# Patient Record
Sex: Male | Born: 1960 | Race: Black or African American | Hispanic: No | Smoking: Current every day smoker
Health system: Southern US, Community
[De-identification: ages and names within clinical notes are randomized; demographics above are authoritative.]

## PROBLEM LIST (undated history)

## (undated) DIAGNOSIS — H269 Unspecified cataract: Secondary | ICD-10-CM

## (undated) DIAGNOSIS — H5440 Blindness, one eye, unspecified eye: Secondary | ICD-10-CM

## (undated) DIAGNOSIS — M1990 Unspecified osteoarthritis, unspecified site: Secondary | ICD-10-CM

## (undated) HISTORY — DX: Unspecified cataract: H26.9

## (undated) HISTORY — DX: Blindness, one eye, unspecified eye: H54.40

## (undated) HISTORY — DX: Unspecified osteoarthritis, unspecified site: M19.90

---

## 2002-06-02 ENCOUNTER — Emergency Department (HOSPITAL_COMMUNITY): Admission: EM | Admit: 2002-06-02 | Discharge: 2002-06-02 | Payer: Self-pay | Admitting: Emergency Medicine

## 2002-06-02 ENCOUNTER — Encounter: Payer: Self-pay | Admitting: Emergency Medicine

## 2004-08-26 ENCOUNTER — Ambulatory Visit: Payer: Self-pay | Admitting: *Deleted

## 2005-02-16 ENCOUNTER — Emergency Department (HOSPITAL_COMMUNITY): Admission: EM | Admit: 2005-02-16 | Discharge: 2005-02-16 | Payer: Self-pay | Admitting: Emergency Medicine

## 2005-03-16 ENCOUNTER — Ambulatory Visit: Payer: Self-pay | Admitting: Nurse Practitioner

## 2006-02-21 ENCOUNTER — Emergency Department (HOSPITAL_COMMUNITY): Admission: EM | Admit: 2006-02-21 | Discharge: 2006-02-21 | Payer: Self-pay | Admitting: *Deleted

## 2006-03-10 ENCOUNTER — Emergency Department (HOSPITAL_COMMUNITY): Admission: EM | Admit: 2006-03-10 | Discharge: 2006-03-10 | Payer: Self-pay | Admitting: Emergency Medicine

## 2006-03-26 ENCOUNTER — Emergency Department (HOSPITAL_COMMUNITY): Admission: EM | Admit: 2006-03-26 | Discharge: 2006-03-26 | Payer: Self-pay | Admitting: Emergency Medicine

## 2006-05-06 ENCOUNTER — Emergency Department (HOSPITAL_COMMUNITY): Admission: EM | Admit: 2006-05-06 | Discharge: 2006-05-06 | Payer: Self-pay | Admitting: Emergency Medicine

## 2006-06-06 ENCOUNTER — Emergency Department (HOSPITAL_COMMUNITY): Admission: EM | Admit: 2006-06-06 | Discharge: 2006-06-06 | Payer: Self-pay | Admitting: Emergency Medicine

## 2007-04-19 ENCOUNTER — Emergency Department (HOSPITAL_COMMUNITY): Admission: EM | Admit: 2007-04-19 | Discharge: 2007-04-19 | Payer: Self-pay | Admitting: Emergency Medicine

## 2007-05-06 ENCOUNTER — Emergency Department (HOSPITAL_COMMUNITY): Admission: EM | Admit: 2007-05-06 | Discharge: 2007-05-06 | Payer: Self-pay | Admitting: Emergency Medicine

## 2007-05-27 ENCOUNTER — Emergency Department (HOSPITAL_COMMUNITY): Admission: EM | Admit: 2007-05-27 | Discharge: 2007-05-27 | Payer: Self-pay | Admitting: Emergency Medicine

## 2007-07-03 ENCOUNTER — Emergency Department (HOSPITAL_COMMUNITY): Admission: EM | Admit: 2007-07-03 | Discharge: 2007-07-03 | Payer: Self-pay | Admitting: Emergency Medicine

## 2007-08-01 ENCOUNTER — Emergency Department (HOSPITAL_COMMUNITY): Admission: EM | Admit: 2007-08-01 | Discharge: 2007-08-01 | Payer: Self-pay | Admitting: Emergency Medicine

## 2007-09-15 ENCOUNTER — Emergency Department (HOSPITAL_COMMUNITY): Admission: EM | Admit: 2007-09-15 | Discharge: 2007-09-15 | Payer: Self-pay | Admitting: Emergency Medicine

## 2007-10-07 ENCOUNTER — Emergency Department (HOSPITAL_COMMUNITY): Admission: EM | Admit: 2007-10-07 | Discharge: 2007-10-07 | Payer: Self-pay | Admitting: Emergency Medicine

## 2007-10-24 ENCOUNTER — Emergency Department (HOSPITAL_COMMUNITY): Admission: EM | Admit: 2007-10-24 | Discharge: 2007-10-24 | Payer: Self-pay | Admitting: Emergency Medicine

## 2007-11-05 ENCOUNTER — Emergency Department (HOSPITAL_COMMUNITY): Admission: EM | Admit: 2007-11-05 | Discharge: 2007-11-05 | Payer: Self-pay | Admitting: Emergency Medicine

## 2008-01-30 ENCOUNTER — Emergency Department (HOSPITAL_COMMUNITY): Admission: EM | Admit: 2008-01-30 | Discharge: 2008-01-30 | Payer: Self-pay | Admitting: Emergency Medicine

## 2008-02-16 ENCOUNTER — Emergency Department (HOSPITAL_COMMUNITY): Admission: EM | Admit: 2008-02-16 | Discharge: 2008-02-16 | Payer: Self-pay | Admitting: Emergency Medicine

## 2008-02-20 ENCOUNTER — Emergency Department (HOSPITAL_COMMUNITY): Admission: EM | Admit: 2008-02-20 | Discharge: 2008-02-20 | Payer: Self-pay | Admitting: Emergency Medicine

## 2008-02-26 ENCOUNTER — Emergency Department (HOSPITAL_COMMUNITY): Admission: EM | Admit: 2008-02-26 | Discharge: 2008-02-26 | Payer: Self-pay | Admitting: Emergency Medicine

## 2008-03-01 ENCOUNTER — Emergency Department (HOSPITAL_COMMUNITY): Admission: EM | Admit: 2008-03-01 | Discharge: 2008-03-02 | Payer: Self-pay | Admitting: Emergency Medicine

## 2008-03-05 ENCOUNTER — Emergency Department (HOSPITAL_COMMUNITY): Admission: EM | Admit: 2008-03-05 | Discharge: 2008-03-05 | Payer: Self-pay | Admitting: Emergency Medicine

## 2008-03-09 ENCOUNTER — Emergency Department (HOSPITAL_COMMUNITY): Admission: EM | Admit: 2008-03-09 | Discharge: 2008-03-10 | Payer: Self-pay | Admitting: Emergency Medicine

## 2008-03-15 ENCOUNTER — Emergency Department (HOSPITAL_COMMUNITY): Admission: EM | Admit: 2008-03-15 | Discharge: 2008-03-15 | Payer: Self-pay | Admitting: Emergency Medicine

## 2008-03-19 ENCOUNTER — Emergency Department (HOSPITAL_COMMUNITY): Admission: EM | Admit: 2008-03-19 | Discharge: 2008-03-19 | Payer: Self-pay | Admitting: Emergency Medicine

## 2008-03-29 ENCOUNTER — Emergency Department (HOSPITAL_COMMUNITY): Admission: EM | Admit: 2008-03-29 | Discharge: 2008-03-29 | Payer: Self-pay | Admitting: Emergency Medicine

## 2008-04-08 ENCOUNTER — Emergency Department (HOSPITAL_COMMUNITY): Admission: EM | Admit: 2008-04-08 | Discharge: 2008-04-08 | Payer: Self-pay | Admitting: Emergency Medicine

## 2008-04-14 ENCOUNTER — Emergency Department (HOSPITAL_COMMUNITY): Admission: EM | Admit: 2008-04-14 | Discharge: 2008-04-14 | Payer: Self-pay | Admitting: Emergency Medicine

## 2008-04-28 ENCOUNTER — Emergency Department (HOSPITAL_COMMUNITY): Admission: EM | Admit: 2008-04-28 | Discharge: 2008-04-28 | Payer: Self-pay | Admitting: Emergency Medicine

## 2008-05-08 ENCOUNTER — Emergency Department (HOSPITAL_COMMUNITY): Admission: EM | Admit: 2008-05-08 | Discharge: 2008-05-08 | Payer: Self-pay | Admitting: Emergency Medicine

## 2008-07-02 ENCOUNTER — Emergency Department (HOSPITAL_COMMUNITY): Admission: EM | Admit: 2008-07-02 | Discharge: 2008-07-02 | Payer: Self-pay | Admitting: Emergency Medicine

## 2009-02-27 ENCOUNTER — Emergency Department (HOSPITAL_COMMUNITY): Admission: EM | Admit: 2009-02-27 | Discharge: 2009-02-27 | Payer: Self-pay | Admitting: Emergency Medicine

## 2009-03-01 ENCOUNTER — Emergency Department (HOSPITAL_COMMUNITY): Admission: EM | Admit: 2009-03-01 | Discharge: 2009-03-02 | Payer: Self-pay | Admitting: Emergency Medicine

## 2010-01-23 ENCOUNTER — Emergency Department (HOSPITAL_COMMUNITY): Admission: EM | Admit: 2010-01-23 | Discharge: 2010-01-23 | Payer: Self-pay | Admitting: Emergency Medicine

## 2010-02-17 ENCOUNTER — Emergency Department (HOSPITAL_COMMUNITY): Admission: EM | Admit: 2010-02-17 | Discharge: 2010-02-17 | Payer: Self-pay | Admitting: Emergency Medicine

## 2010-03-05 ENCOUNTER — Emergency Department (HOSPITAL_COMMUNITY): Admission: EM | Admit: 2010-03-05 | Discharge: 2010-03-06 | Payer: Self-pay | Admitting: Emergency Medicine

## 2010-03-11 ENCOUNTER — Emergency Department (HOSPITAL_COMMUNITY): Admission: EM | Admit: 2010-03-11 | Discharge: 2010-03-11 | Payer: Self-pay | Admitting: Emergency Medicine

## 2010-03-22 ENCOUNTER — Observation Stay (HOSPITAL_COMMUNITY): Admission: EM | Admit: 2010-03-22 | Discharge: 2010-03-22 | Payer: Self-pay | Admitting: Emergency Medicine

## 2010-04-18 ENCOUNTER — Emergency Department (HOSPITAL_COMMUNITY): Admission: EM | Admit: 2010-04-18 | Discharge: 2010-04-18 | Payer: Self-pay | Admitting: Emergency Medicine

## 2010-09-11 ENCOUNTER — Emergency Department (HOSPITAL_COMMUNITY): Admission: EM | Admit: 2010-09-11 | Discharge: 2010-02-23 | Payer: Self-pay | Admitting: Emergency Medicine

## 2011-01-05 ENCOUNTER — Emergency Department (HOSPITAL_COMMUNITY)
Admission: EM | Admit: 2011-01-05 | Discharge: 2011-01-05 | Disposition: A | Discharge status: Home / Self Care | Payer: Self-pay | Attending: Emergency Medicine | Admitting: Emergency Medicine

## 2011-01-05 DIAGNOSIS — J45909 Unspecified asthma, uncomplicated: Secondary | ICD-10-CM | POA: Insufficient documentation

## 2011-01-05 DIAGNOSIS — Z5900 Homelessness unspecified: Secondary | ICD-10-CM | POA: Insufficient documentation

## 2011-01-13 LAB — DIFFERENTIAL
Basophils Absolute: 0 10*3/uL (ref 0.0–0.1)
Eosinophils Absolute: 0.2 10*3/uL (ref 0.0–0.7)
Lymphs Abs: 1.4 10*3/uL (ref 0.7–4.0)
Neutrophils Relative %: 73 % (ref 43–77)

## 2011-01-13 LAB — BASIC METABOLIC PANEL
BUN: 14 mg/dL (ref 6–23)
CO2: 25 mEq/L (ref 19–32)
Calcium: 9.2 mg/dL (ref 8.4–10.5)
Chloride: 103 mEq/L (ref 96–112)
Creatinine, Ser: 0.97 mg/dL (ref 0.4–1.5)
GFR calc Af Amer: >60 mL/min (ref >60)
Glucose, Bld: 73 mg/dL (ref 70–99)
Potassium: 4 mEq/L (ref 3.5–5.1)
Sodium: 137 mEq/L (ref 135–145)

## 2011-01-13 LAB — CBC
HCT: 43.8 % (ref 39.0–52.0)
Hemoglobin: 14.7 g/dL (ref 13.0–17.0)
MCV: 91.1 fL (ref 78.0–100.0)
WBC: 7.7 10*3/uL (ref 4.0–10.5)

## 2011-01-21 ENCOUNTER — Emergency Department (HOSPITAL_COMMUNITY)
Admission: EM | Admit: 2011-01-21 | Discharge: 2011-01-21 | Disposition: A | Discharge status: Home / Self Care | Payer: Self-pay | Attending: Emergency Medicine | Admitting: Emergency Medicine

## 2011-01-21 DIAGNOSIS — J45909 Unspecified asthma, uncomplicated: Secondary | ICD-10-CM | POA: Insufficient documentation

## 2011-01-28 ENCOUNTER — Emergency Department (HOSPITAL_COMMUNITY)
Admission: EM | Admit: 2011-01-28 | Discharge: 2011-01-28 | Disposition: A | Discharge status: Home / Self Care | Payer: Self-pay | Attending: Emergency Medicine | Admitting: Emergency Medicine

## 2011-01-28 DIAGNOSIS — J45909 Unspecified asthma, uncomplicated: Secondary | ICD-10-CM | POA: Insufficient documentation

## 2011-01-28 DIAGNOSIS — Z5900 Homelessness unspecified: Secondary | ICD-10-CM | POA: Insufficient documentation

## 2011-01-28 DIAGNOSIS — F17200 Nicotine dependence, unspecified, uncomplicated: Secondary | ICD-10-CM | POA: Insufficient documentation

## 2011-02-08 ENCOUNTER — Emergency Department (HOSPITAL_COMMUNITY)
Admission: EM | Admit: 2011-02-08 | Discharge: 2011-02-08 | Disposition: A | Discharge status: Home / Self Care | Payer: Self-pay | Attending: Emergency Medicine | Admitting: Emergency Medicine

## 2011-02-08 DIAGNOSIS — J45909 Unspecified asthma, uncomplicated: Secondary | ICD-10-CM | POA: Insufficient documentation

## 2011-02-17 ENCOUNTER — Emergency Department (HOSPITAL_COMMUNITY): Payer: Self-pay

## 2011-02-17 ENCOUNTER — Emergency Department (HOSPITAL_COMMUNITY)
Admission: EM | Admit: 2011-02-17 | Discharge: 2011-02-17 | Disposition: A | Discharge status: Home / Self Care | Payer: Self-pay | Attending: Emergency Medicine | Admitting: Emergency Medicine

## 2011-02-17 DIAGNOSIS — J45909 Unspecified asthma, uncomplicated: Secondary | ICD-10-CM | POA: Insufficient documentation

## 2011-02-17 DIAGNOSIS — F411 Generalized anxiety disorder: Secondary | ICD-10-CM | POA: Insufficient documentation

## 2011-02-17 DIAGNOSIS — R079 Chest pain, unspecified: Secondary | ICD-10-CM | POA: Insufficient documentation

## 2011-02-17 LAB — BASIC METABOLIC PANEL
BUN: 9 mg/dL (ref 6–23)
CO2: 25 mEq/L (ref 19–32)
Chloride: 96 mEq/L (ref 96–112)
Creatinine, Ser: 0.88 mg/dL (ref 0.4–1.5)
GFR calc Af Amer: >60 mL/min (ref >60)
Glucose, Bld: 86 mg/dL (ref 70–99)
Potassium: 3.8 mEq/L (ref 3.5–5.1)

## 2011-02-17 LAB — DIFFERENTIAL
Basophils Relative: 1 % (ref 0–1)
Eosinophils Absolute: 0.3 10*3/uL (ref 0.0–0.7)
Lymphocytes Relative: 13 % (ref 12–46)
Monocytes Relative: 5 % (ref 3–12)
Neutrophils Relative %: 77 % (ref 43–77)

## 2011-02-17 LAB — CBC
HCT: 43.8 % (ref 39.0–52.0)
MCH: 31.1 pg (ref 26.0–34.0)
MCHC: 34.7 g/dL (ref 30.0–36.0)
MCV: 89.6 fL (ref 78.0–100.0)
RDW: 13.6 % (ref 11.5–15.5)
WBC: 6.8 10*3/uL (ref 4.0–10.5)

## 2011-03-22 ENCOUNTER — Emergency Department (HOSPITAL_COMMUNITY)
Admission: EM | Admit: 2011-03-22 | Discharge: 2011-03-22 | Disposition: A | Discharge status: Home / Self Care | Payer: Self-pay | Attending: Emergency Medicine | Admitting: Emergency Medicine

## 2011-03-22 DIAGNOSIS — F17200 Nicotine dependence, unspecified, uncomplicated: Secondary | ICD-10-CM | POA: Insufficient documentation

## 2011-03-22 DIAGNOSIS — R0602 Shortness of breath: Secondary | ICD-10-CM | POA: Insufficient documentation

## 2011-03-22 DIAGNOSIS — J45909 Unspecified asthma, uncomplicated: Secondary | ICD-10-CM | POA: Insufficient documentation

## 2011-03-29 ENCOUNTER — Emergency Department (HOSPITAL_COMMUNITY): Payer: Self-pay

## 2011-03-29 ENCOUNTER — Emergency Department (HOSPITAL_COMMUNITY)
Admission: EM | Admit: 2011-03-29 | Discharge: 2011-03-29 | Disposition: A | Discharge status: Home / Self Care | Payer: Self-pay | Attending: Emergency Medicine | Admitting: Emergency Medicine

## 2011-03-29 DIAGNOSIS — J45909 Unspecified asthma, uncomplicated: Secondary | ICD-10-CM | POA: Insufficient documentation

## 2011-03-29 DIAGNOSIS — Z5900 Homelessness unspecified: Secondary | ICD-10-CM | POA: Insufficient documentation

## 2011-03-29 DIAGNOSIS — R059 Cough, unspecified: Secondary | ICD-10-CM | POA: Insufficient documentation

## 2011-03-29 DIAGNOSIS — R05 Cough: Secondary | ICD-10-CM | POA: Insufficient documentation

## 2011-03-29 DIAGNOSIS — F17200 Nicotine dependence, unspecified, uncomplicated: Secondary | ICD-10-CM | POA: Insufficient documentation

## 2011-03-29 DIAGNOSIS — R0602 Shortness of breath: Secondary | ICD-10-CM | POA: Insufficient documentation

## 2011-04-20 ENCOUNTER — Emergency Department (HOSPITAL_COMMUNITY)
Admission: EM | Admit: 2011-04-20 | Discharge: 2011-04-20 | Disposition: A | Discharge status: Home / Self Care | Payer: Self-pay | Attending: Emergency Medicine | Admitting: Emergency Medicine

## 2011-04-20 DIAGNOSIS — J45901 Unspecified asthma with (acute) exacerbation: Secondary | ICD-10-CM | POA: Insufficient documentation

## 2011-04-20 DIAGNOSIS — R05 Cough: Secondary | ICD-10-CM | POA: Insufficient documentation

## 2011-04-20 DIAGNOSIS — R0602 Shortness of breath: Secondary | ICD-10-CM | POA: Insufficient documentation

## 2011-04-20 DIAGNOSIS — R059 Cough, unspecified: Secondary | ICD-10-CM | POA: Insufficient documentation

## 2011-05-05 ENCOUNTER — Emergency Department (HOSPITAL_COMMUNITY)
Admission: EM | Admit: 2011-05-05 | Discharge: 2011-05-05 | Disposition: A | Discharge status: Home / Self Care | Payer: Self-pay | Attending: Emergency Medicine | Admitting: Emergency Medicine

## 2011-05-05 DIAGNOSIS — J45909 Unspecified asthma, uncomplicated: Secondary | ICD-10-CM | POA: Insufficient documentation

## 2011-05-05 DIAGNOSIS — Z5900 Homelessness unspecified: Secondary | ICD-10-CM | POA: Insufficient documentation

## 2011-05-05 DIAGNOSIS — F17200 Nicotine dependence, unspecified, uncomplicated: Secondary | ICD-10-CM | POA: Insufficient documentation

## 2011-07-01 LAB — CBC
HCT: 42.7
MCHC: 35.1
RDW: 13.3

## 2011-07-01 LAB — COMPREHENSIVE METABOLIC PANEL
AST: 36
Albumin: 3.8
Calcium: 9.6
Creatinine, Ser: 1.3
GFR calc Af Amer: >60
GFR calc non Af Amer: 59 — ABNORMAL LOW

## 2011-07-01 LAB — URINE CULTURE
Colony Count: NO GROWTH
Culture: NO GROWTH

## 2011-07-01 LAB — URINALYSIS, ROUTINE W REFLEX MICROSCOPIC
Ketones, ur: NEGATIVE
Nitrite: POSITIVE — AB
Specific Gravity, Urine: 1.024
Urobilinogen, UA: 0.2
pH: 5.5

## 2011-07-01 LAB — URINE MICROSCOPIC-ADD ON

## 2011-12-08 ENCOUNTER — Emergency Department (HOSPITAL_COMMUNITY)
Admission: EM | Admit: 2011-12-08 | Discharge: 2011-12-08 | Disposition: A | Discharge status: Home / Self Care | Payer: Self-pay | Attending: Emergency Medicine | Admitting: Emergency Medicine

## 2011-12-08 ENCOUNTER — Emergency Department (HOSPITAL_COMMUNITY): Payer: Self-pay

## 2011-12-08 ENCOUNTER — Encounter (HOSPITAL_COMMUNITY): Payer: Self-pay | Admitting: Emergency Medicine

## 2011-12-08 DIAGNOSIS — J441 Chronic obstructive pulmonary disease with (acute) exacerbation: Secondary | ICD-10-CM | POA: Insufficient documentation

## 2011-12-08 DIAGNOSIS — F17200 Nicotine dependence, unspecified, uncomplicated: Secondary | ICD-10-CM | POA: Insufficient documentation

## 2011-12-08 MED ORDER — IPRATROPIUM BROMIDE 0.02 % IN SOLN
RESPIRATORY_TRACT | Status: AC
  Filled 2011-12-08: qty 2.5

## 2011-12-08 MED ORDER — IPRATROPIUM BROMIDE 0.02 % IN SOLN
0.5000 mg | Freq: Once | RESPIRATORY_TRACT | Status: AC
  Administered 2011-12-08: 0.5 mg via RESPIRATORY_TRACT

## 2011-12-08 MED ORDER — ALBUTEROL SULFATE (5 MG/ML) 0.5% IN NEBU
INHALATION_SOLUTION | RESPIRATORY_TRACT | Status: AC
  Filled 2011-12-08: qty 1

## 2011-12-08 MED ORDER — ALBUTEROL SULFATE (5 MG/ML) 0.5% IN NEBU
5.0000 mg | INHALATION_SOLUTION | Freq: Once | RESPIRATORY_TRACT | Status: AC
  Administered 2011-12-08: 5 mg via RESPIRATORY_TRACT

## 2011-12-08 MED ORDER — PREDNISONE (PAK) 10 MG PO TABS: 40.0000 mg | ORAL_TABLET | Freq: Every day | ORAL | Status: AC

## 2011-12-08 MED ORDER — PREDNISONE 20 MG PO TABS
60.0000 mg | ORAL_TABLET | Freq: Once | ORAL | Status: AC
  Administered 2011-12-08: 60 mg via ORAL
  Filled 2011-12-08: qty 3

## 2011-12-08 MED ORDER — ALBUTEROL SULFATE HFA 108 (90 BASE) MCG/ACT IN AERS
2.0000 | INHALATION_SPRAY | Freq: Once | RESPIRATORY_TRACT | Status: AC
  Administered 2011-12-08: 2 via RESPIRATORY_TRACT
  Filled 2011-12-08: qty 6.7

## 2011-12-08 NOTE — ED Provider Notes (Signed)
History     CSN: [id number]  Arrival date & time 12/08/11  7829   First MD Initiated Contact with Patient 12/08/11 2211      Chief Complaint  Patient presents with  . Asthma    (Consider location/radiation/quality/duration/timing/severity/associated sxs/prior treatment) Patient is a 51 y.o. male presenting with asthma. The history is provided by the patient.  Asthma This is a new problem. The current episode started today. The problem has been unchanged. Associated symptoms include coughing. Pertinent negatives include no abdominal pain, chest pain, chills, congestion, diaphoresis, fever, myalgias, nausea, neck pain, numbness, sore throat, vomiting or weakness.  Pt states he has had increased cough, wheezing, SOB today. States hx of asthma and COPD. Ran out of inhaler. States not improving over the day so came in. Pt denies URI symptoms, denies fever, chills, chest pain, malaise.   Past Medical History  Diagnosis Date  . Asthma     History reviewed. No pertinent past surgical history.  History reviewed. No pertinent family history.  History  Substance Use Topics  . Smoking status: Current Everyday Smoker  . Smokeless tobacco: Not on file  . Alcohol Use: Yes      Review of Systems  Constitutional: Negative for fever, chills and diaphoresis.  HENT: Negative for congestion, sore throat and neck pain.   Eyes: Negative.   Respiratory: Positive for cough, shortness of breath and wheezing. Negative for chest tightness.   Cardiovascular: Negative for chest pain, palpitations and leg swelling.  Gastrointestinal: Negative for nausea, vomiting and abdominal pain.  Genitourinary: Negative.   Musculoskeletal: Negative for myalgias and back pain.  Skin: Negative.   Neurological: Negative for weakness and numbness.  Psychiatric/Behavioral: Negative.     Allergies  Review of patient's allergies indicates no known allergies.  Home Medications   Current Outpatient Rx  Name  Route Sig Dispense Refill  . ALBUTEROL SULFATE (2.5 MG/3ML) 0.083% IN NEBU Nebulization Take 2.5 mg by nebulization every 6 (six) hours as needed.    Marland Kitchen PREDNISONE (PAK) 10 MG PO TABS Oral Take 4 tablets (40 mg total) by mouth daily. 16 tablet 0    BP 133/82  Pulse 67  Temp(Src) 98.5 F (36.9 C) (Oral)  Resp 18  SpO2 95%  Physical Exam  Nursing note and vitals reviewed. Constitutional: He is oriented to person, place, and time. He appears well-developed and well-nourished. No distress.  HENT:  Head: Normocephalic and atraumatic.  Eyes: Conjunctivae are normal.  Neck: Neck supple.  Cardiovascular: Normal rate, regular rhythm and normal heart sounds.   Pulmonary/Chest: Effort normal. No respiratory distress. He has no rales.       Inspiratory and expiratory wheezes in all lung fields. Decreased air movement bilaterally  Abdominal: Soft. Bowel sounds are normal. There is no tenderness.  Musculoskeletal: Normal range of motion. He exhibits no edema.  Neurological: He is alert and oriented to person, place, and time.  Skin: Skin is warm and dry.  Psychiatric: He has a normal mood and affect.    ED Course  Procedures (including critical care time)  Labs Reviewed - No data to display Dg Chest 2 View  12/08/2011  *RADIOLOGY REPORT*  Clinical Data: Asthma, wheezing, chest pain, smoker  CHEST - 2 VIEW  Comparison: 03/29/2011  Findings: Normal heart size, mediastinal contours, and pulmonary vascularity. Peribronchial thickening and mild hyperinflation. Lungs clear. No pleural effusion or pneumothorax. Bones unremarkable.  IMPRESSION: Peribronchial thickening and hyperinflation question related to asthma or COPD. No acute infiltrate.  Original Report  Authenticated By: Lollie Marrow, M.D.   Pt with SOB. States he just wants an inhaler and ready to go home. Pt is wheezing. Oxygen sat 92 on RA. Pt received albuterol and Atrovent neb treatment. Wheezing significantly improved and he is feeling  better. PRednisone given. Oxygen sat at 96% on RA. Pt asking to be discharged. Will d/c home with an inhaler, prednisone pack, and close follow up. Instructed to return if worsening. He has no current symptoms.   1. COPD exacerbation       MDM          Lottie Mussel, PA 12/08/11 2237

## 2011-12-08 NOTE — ED Notes (Signed)
Pt sts that he has had an increase in SOB and his asthma sx. Pt is out of his albuterol as of yesterday. Pt noted with wheezing in triage.

## 2011-12-08 NOTE — Discharge Instructions (Signed)
Stop smoking. Take albuterol inhaler 2 puffs every 4 hrs for the next 3 days, then as needed. Take prednisone as prescribed until all gone starting tomorrow. Follow up with your doctor. Return if worsening.  Bronchitis Bronchitis is the body's way of reacting to injury and/or infection (inflammation) of the bronchi. Bronchi are the air tubes that extend from the windpipe into the lungs. If the inflammation becomes severe, it may cause shortness of breath. CAUSES  Inflammation may be caused by:  A virus.   Germs (bacteria).   Dust.   Allergens.   Pollutants and many other irritants.  The cells lining the bronchial tree are covered with tiny hairs (cilia). These constantly beat upward, away from the lungs, toward the mouth. This keeps the lungs free of pollutants. When these cells become too irritated and are unable to do their job, mucus begins to develop. This causes the characteristic cough of bronchitis. The cough clears the lungs when the cilia are unable to do their job. Without either of these protective mechanisms, the mucus would settle in the lungs. Then you would develop pneumonia. Smoking is a common cause of bronchitis and can contribute to pneumonia. Stopping this habit is the single most important thing you can do to help yourself. TREATMENT   Your caregiver may prescribe an antibiotic if the cough is caused by bacteria. Also, medicines that open up your airways make it easier to breathe. Your caregiver may also recommend or prescribe an expectorant. It will loosen the mucus to be coughed up. Only take over-the-counter or prescription medicines for pain, discomfort, or fever as directed by your caregiver.   Removing whatever causes the problem (smoking, for example) is critical to preventing the problem from getting worse.   Cough suppressants may be prescribed for relief of cough symptoms.   Inhaled medicines may be prescribed to help with symptoms now and to help prevent  problems from returning.   For those with recurrent (chronic) bronchitis, there may be a need for steroid medicines.  SEEK IMMEDIATE MEDICAL CARE IF:   During treatment, you develop more pus-like mucus (purulent sputum).   You have a fever.   Your baby is older than 3 months with a rectal temperature of 102 F (38.9 C) or higher.   Your baby is 56 months old or younger with a rectal temperature of 100.4 F (38 C) or higher.   You become progressively more ill.   You have increased difficulty breathing, wheezing, or shortness of breath.  It is necessary to seek immediate medical care if you are elderly or sick from any other disease. MAKE SURE YOU:   Understand these instructions.   Will watch your condition.   Will get help right away if you are not doing well or get worse.  Document Released: 09/21/2005 Document Revised: 09/10/2011 Document Reviewed: 07/31/2008 [facility] Patient Information 2012 [place], [place].

## 2011-12-09 NOTE — ED Provider Notes (Signed)
Medical screening examination/treatment/procedure(s) were performed by non-physician practitioner and as supervising physician I was immediately available for consultation/collaboration.   Oceane Fosse, MD 12/09/11 2038 

## 2011-12-21 ENCOUNTER — Encounter (HOSPITAL_COMMUNITY): Payer: Self-pay

## 2011-12-21 ENCOUNTER — Emergency Department (INDEPENDENT_AMBULATORY_CARE_PROVIDER_SITE_OTHER)
Admission: EM | Admit: 2011-12-21 | Discharge: 2011-12-21 | Disposition: A | Discharge status: Home / Self Care | Payer: Self-pay | Source: Home / Self Care

## 2011-12-21 DIAGNOSIS — J45909 Unspecified asthma, uncomplicated: Secondary | ICD-10-CM

## 2011-12-21 MED ORDER — ALBUTEROL SULFATE HFA 108 (90 BASE) MCG/ACT IN AERS
2.0000 | INHALATION_SPRAY | RESPIRATORY_TRACT | Status: DC | PRN
  Administered 2011-12-21: 2 via RESPIRATORY_TRACT

## 2011-12-21 MED ORDER — METHYLPREDNISOLONE ACETATE 80 MG/ML IJ SUSP
80.0000 mg | Freq: Once | INTRAMUSCULAR | Status: AC
  Administered 2011-12-21: 80 mg via INTRAMUSCULAR

## 2011-12-21 MED ORDER — METHYLPREDNISOLONE ACETATE 80 MG/ML IJ SUSP
INTRAMUSCULAR | Status: AC
  Filled 2011-12-21: qty 1

## 2011-12-21 MED ORDER — ALBUTEROL SULFATE HFA 108 (90 BASE) MCG/ACT IN AERS
INHALATION_SPRAY | RESPIRATORY_TRACT | Status: AC
  Filled 2011-12-21: qty 6.7

## 2011-12-21 NOTE — ED Notes (Addendum)
States out of albuterol MDi since yesterday; smokes 1 pack/day plus episodic THC usage; did not fill his Rx from 3-5 visit

## 2011-12-21 NOTE — Discharge Instructions (Signed)
Stop smoking! Continue albuterol inhaler as needed. As we discussed it is important that you apply for your orange card and try to get an appt with a primary care doctor at [facility]. Then you will be able to use the [facility] pharmacy and they should be able to put you on controller medications to keep your asthma better controlled. This will help you to breathe better and stay out of the emergency room as often.  Return as needed.  Asthma, Adult Asthma is a disease of the lungs and can make it hard to breathe. Asthma cannot be cured, but medicine can help control it. Asthma may be started (triggered) by:  Pollen.   Dust.   Animal skin flakes (dander).   Molds.   Foods.   Respiratory infections (colds, flu).   Smoke.   Exercise.   Stress.   Other things that cause allergic reactions or allergies (allergens).  HOME CARE   Talk to your doctor about how to manage your attacks at home. This may include:   Using a tool called a peak flow meter.   Having medicine ready to stop the attack.   Take all medicine as told by your doctor.   Wash bed sheets and blankets every week in hot water and put them in the dryer.   Drink enough fluids to keep your pee (urine) clear or pale yellow.   Always be ready to get emergency help. Write down the phone number for your doctor. Keep it where you can easily find it.   Talk about exercise routines with your doctor.   If animal dander is causing your asthma, you may need to find a new home for your pet(s).  GET HELP RIGHT AWAY IF:   You have muscle aches.   You cough more.   You have chest pain.   You have thick spit (sputum) that changes to yellow, green, gray, or bloody.   Medicine does not stop your wheezing.   You have problems breathing.   You have a fever.   Your medicine causes:   A rash.   Itching.   Puffiness (swelling).   Breathing problems.  MAKE SURE YOU:   Understand these instructions.   Will  watch your condition.   Will get help right away if you are not doing well or get worse.  Document Released: 03/09/2008 Document Revised: 09/10/2011 Document Reviewed: 08/01/2008 [facility] Patient Information 2012 [place], [place].

## 2011-12-21 NOTE — ED Provider Notes (Signed)
Medical screening examination/treatment/procedure(s) were performed by non-physician practitioner and as supervising physician I was immediately available for consultation/collaboration.  Leslee Home, M.D.   Reuben Likes, MD 12/21/11 [phone]

## 2011-12-21 NOTE — ED Provider Notes (Signed)
History     CSN: [id number]  Arrival date & time 12/21/11  4098   None     Chief Complaint  Patient presents with  . Asthma    (Consider location/radiation/quality/duration/timing/severity/associated sxs/prior treatment) HPI Comments: Patient presents today stating that he ran on his his albuterol inhaler. He has a history of asthma. He was seen in the ED 2 weeks ago and was prescribed an albuterol inhaler as well as oral prednisone for COPD exacerbation. He states that he was unable to afford the inhaler or the prednisone so did not get either prescription filled. He has a history of multiple ED visits and had one hospitalization for asthma exacerbation in 2011. He is uninsured and has no primary care provider. When questioned about his albuterol use frequency he states he on average awakens 2 times every night, and uses an average of 4 times during the day. He is a smoker and also admits to marijuana use.   Past Medical History  Diagnosis Date  . Asthma     History reviewed. No pertinent past surgical history.  No family history on file.  History  Substance Use Topics  . Smoking status: Current Everyday Smoker  . Smokeless tobacco: Not on file  . Alcohol Use: Yes      Review of Systems  Constitutional: Negative for fever, chills and fatigue.  HENT: Negative for ear pain, congestion, sore throat and rhinorrhea.   Respiratory: Positive for cough, shortness of breath and wheezing.   Cardiovascular: Negative for chest pain.    Allergies  Review of patient's allergies indicates no known allergies.  Home Medications   Current Outpatient Rx  Name Route Sig Dispense Refill  . ALBUTEROL SULFATE (2.5 MG/3ML) 0.083% IN NEBU Nebulization Take 2.5 mg by nebulization every 6 (six) hours as needed.      BP 129/84  Pulse 62  Temp(Src) 98.4 F (36.9 C) (Oral)  Resp 18  SpO2 97%  Physical Exam  Nursing note and vitals reviewed. Constitutional: He appears well-developed  and well-nourished. No distress.  HENT:  Head: Normocephalic and atraumatic.  Right Ear: Tympanic membrane, external ear and ear canal normal.  Left Ear: Tympanic membrane, external ear and ear canal normal.  Nose: Nose normal.  Mouth/Throat: Uvula is midline, oropharynx is clear and moist and mucous membranes are normal. No oropharyngeal exudate, posterior oropharyngeal edema or posterior oropharyngeal erythema.  Neck: Neck supple.  Cardiovascular: Normal rate, regular rhythm and normal heart sounds.   Pulmonary/Chest: Effort normal. No respiratory distress. He has wheezes (scattered expiratory wheeze bilat).  Lymphadenopathy:    He has no cervical adenopathy.  Neurological: He is alert.  Skin: Skin is warm and dry.  Psychiatric: He has a normal mood and affect.    ED Course  Procedures (including critical care time)  Labs Reviewed - No data to display No results found.   1. Asthma       MDM  Pt with longstanding hx of uncontrolled asthma, frequent ED visits and one prior hospitalization in the last 2 yrs. Smoker. Pt may benefit from PCP to start on controller meds. He is uninsured and information was given to apply for the orange card. Discussed with pt the benefits of establishing care with [facility] once he obtains his orange card - use of [facility] pharmacy, controller inhalers, improved asthma control, fewer ED visits, etc.        Melody Comas, PA 12/21/11 1146

## 2012-02-29 ENCOUNTER — Encounter (HOSPITAL_COMMUNITY): Payer: Self-pay | Admitting: Emergency Medicine

## 2012-02-29 ENCOUNTER — Emergency Department (INDEPENDENT_AMBULATORY_CARE_PROVIDER_SITE_OTHER)
Admission: EM | Admit: 2012-02-29 | Discharge: 2012-02-29 | Disposition: A | Discharge status: Home / Self Care | Payer: Self-pay | Source: Home / Self Care | Attending: Family Medicine | Admitting: Family Medicine

## 2012-02-29 DIAGNOSIS — J40 Bronchitis, not specified as acute or chronic: Secondary | ICD-10-CM

## 2012-02-29 MED ORDER — ALBUTEROL SULFATE HFA 108 (90 BASE) MCG/ACT IN AERS: 2.0000 | INHALATION_SPRAY | Freq: Four times a day (QID) | RESPIRATORY_TRACT | Status: DC | PRN

## 2012-02-29 MED ORDER — PREDNISONE 20 MG PO TABS: ORAL_TABLET | ORAL | Status: DC

## 2012-02-29 MED ORDER — ALBUTEROL SULFATE (2.5 MG/3ML) 0.083% IN NEBU: 2.5000 mg | INHALATION_SOLUTION | Freq: Four times a day (QID) | RESPIRATORY_TRACT | Status: DC | PRN

## 2012-02-29 MED ORDER — AZITHROMYCIN 250 MG PO TABS: 250.0000 mg | ORAL_TABLET | Freq: Every day | ORAL | Status: AC

## 2012-02-29 MED ORDER — AZITHROMYCIN 250 MG PO TABS: 250.0000 mg | ORAL_TABLET | Freq: Every day | ORAL | Status: DC

## 2012-02-29 MED ORDER — ALBUTEROL SULFATE (5 MG/ML) 0.5% IN NEBU
2.5000 mg | INHALATION_SOLUTION | Freq: Once | RESPIRATORY_TRACT | Status: AC
  Administered 2012-02-29: 2.5 mg via RESPIRATORY_TRACT

## 2012-02-29 MED ORDER — ALBUTEROL SULFATE (5 MG/ML) 0.5% IN NEBU
INHALATION_SOLUTION | RESPIRATORY_TRACT | Status: AC
  Filled 2012-02-29: qty 1

## 2012-02-29 MED ORDER — IPRATROPIUM BROMIDE 0.02 % IN SOLN
0.5000 mg | Freq: Once | RESPIRATORY_TRACT | Status: AC
  Administered 2012-02-29: 0.5 mg via RESPIRATORY_TRACT

## 2012-02-29 NOTE — Discharge Instructions (Signed)

## 2012-02-29 NOTE — ED Notes (Signed)
Cough onset one week ago.  Cough worse at night, sometimes hard to catch breath.  Productive cough is clear phlegm.  Reports runny nose, no sore throat, no fever

## 2012-03-06 ENCOUNTER — Observation Stay (HOSPITAL_COMMUNITY)
Admission: EM | Admit: 2012-03-06 | Discharge: 2012-03-07 | Disposition: A | Discharge status: Home / Self Care | Payer: Self-pay | Source: Ambulatory Visit | Attending: Emergency Medicine | Admitting: Emergency Medicine

## 2012-03-06 ENCOUNTER — Encounter (HOSPITAL_COMMUNITY): Payer: Self-pay | Admitting: *Deleted

## 2012-03-06 DIAGNOSIS — J45909 Unspecified asthma, uncomplicated: Principal | ICD-10-CM | POA: Insufficient documentation

## 2012-03-06 DIAGNOSIS — R05 Cough: Secondary | ICD-10-CM | POA: Insufficient documentation

## 2012-03-06 DIAGNOSIS — R079 Chest pain, unspecified: Secondary | ICD-10-CM | POA: Insufficient documentation

## 2012-03-06 DIAGNOSIS — R059 Cough, unspecified: Secondary | ICD-10-CM | POA: Insufficient documentation

## 2012-03-06 DIAGNOSIS — J45901 Unspecified asthma with (acute) exacerbation: Secondary | ICD-10-CM

## 2012-03-06 MED ORDER — IPRATROPIUM BROMIDE 0.02 % IN SOLN
RESPIRATORY_TRACT | Status: AC
  Filled 2012-03-06: qty 2.5

## 2012-03-06 MED ORDER — IPRATROPIUM BROMIDE HFA 17 MCG/ACT IN AERS
2.0000 | INHALATION_SPRAY | Freq: Four times a day (QID) | RESPIRATORY_TRACT | Status: DC
  Administered 2012-03-06 – 2012-03-07 (×2): 2 via RESPIRATORY_TRACT
  Filled 2012-03-06: qty 12.9

## 2012-03-06 MED ORDER — METHYLPREDNISOLONE SODIUM SUCC 125 MG IJ SOLR
INTRAMUSCULAR | Status: AC
  Administered 2012-03-06: 20:00:00
  Filled 2012-03-06: qty 2

## 2012-03-06 MED ORDER — PREDNISONE 20 MG PO TABS
60.0000 mg | ORAL_TABLET | Freq: Every day | ORAL | Status: DC
  Administered 2012-03-07: 60 mg via ORAL
  Filled 2012-03-06: qty 3

## 2012-03-06 MED ORDER — MAGNESIUM SULFATE 40 MG/ML IJ SOLN: 2.0000 g | Freq: Two times a day (BID) | INTRAMUSCULAR | Status: DC

## 2012-03-06 MED ORDER — MAGNESIUM SULFATE 40 MG/ML IJ SOLN
2.0000 g | Freq: Once | INTRAMUSCULAR | Status: AC
  Administered 2012-03-06: 2 g via INTRAVENOUS
  Filled 2012-03-06: qty 50

## 2012-03-06 MED ORDER — ALBUTEROL (5 MG/ML) CONTINUOUS INHALATION SOLN
10.0000 mg/h | INHALATION_SOLUTION | Freq: Once | RESPIRATORY_TRACT | Status: AC
  Administered 2012-03-06: 10 mg/h via RESPIRATORY_TRACT
  Filled 2012-03-06: qty 60

## 2012-03-06 MED ORDER — IPRATROPIUM BROMIDE 0.02 % IN SOLN
0.5000 mg | Freq: Once | RESPIRATORY_TRACT | Status: AC
  Administered 2012-03-06: 0.5 mg via RESPIRATORY_TRACT
  Filled 2012-03-06: qty 5

## 2012-03-06 MED ORDER — ALBUTEROL SULFATE (5 MG/ML) 0.5% IN NEBU
INHALATION_SOLUTION | RESPIRATORY_TRACT | Status: AC
  Filled 2012-03-06: qty 2

## 2012-03-06 NOTE — ED Provider Notes (Signed)
History  Scribed for Keith Christen, MD, the patient was seen in room STRE4/STRE4. This chart was scribed by [place] Stagers. The patient's care started at 7:40 PM   CSN: [id number]  Arrival date & time 03/06/12  8657   First MD Initiated Contact with Patient 03/06/12 1934      Chief Complaint  Patient presents with  . Asthma     HPI Keith Atkinson is a 51 y.o. male who BIBA to the Emergency Department complaining of asthma problems due to running out of medication.  Pt is currently homeless.  He is experiencing cough, chest tightness.  Inhaling causes the chest tightness to become worse.  He denies fever or chest pain.    Past Medical History  Diagnosis Date  . Asthma     No past surgical history on file.  No family history on file.  History  Substance Use Topics  . Smoking status: Current Everyday Smoker  . Smokeless tobacco: Not on file  . Alcohol Use: Yes      Review of Systems  Constitutional: Negative.  Negative for fever and chills.  HENT: Negative.   Eyes: Negative.  Negative for discharge and redness.  Respiratory: Positive for cough, chest tightness and shortness of breath.   Cardiovascular: Negative.  Negative for chest pain.  Gastrointestinal: Negative.  Negative for nausea, vomiting and abdominal pain.  Genitourinary: Negative.  Negative for hematuria.  Musculoskeletal: Negative.  Negative for back pain.  Skin: Negative.  Negative for color change and rash.  Neurological: Negative for syncope and headaches.  Hematological: Negative.  Negative for adenopathy.  Psychiatric/Behavioral: Negative.  Negative for confusion.  All other systems reviewed and are negative.    Allergies  Review of patient's allergies indicates no known allergies.  Home Medications   Current Outpatient Rx  Name Route Sig Dispense Refill  . ALBUTEROL SULFATE HFA 108 (90 BASE) MCG/ACT IN AERS Inhalation Inhale 2 puffs into the lungs every 6 (six) hours as needed. For  shortness of breath    . AZITHROMYCIN 250 MG PO TABS Oral Take 1 tablet (250 mg total) by mouth daily. Take first 2 tablets together, then 1 every day until finished. 6 tablet 0    BP 133/69  Pulse 65  Temp(Src) 98.5 F (36.9 C) (Oral)  Resp 20  SpO2 99%  Physical Exam  Nursing note and vitals reviewed. Constitutional: He is oriented to person, place, and time. He appears well-developed and well-nourished. No distress.  HENT:  Head: Normocephalic and atraumatic.  Eyes: EOM are normal. Pupils are equal, round, and reactive to light.  Neck: Neck supple. No tracheal deviation present.  Cardiovascular: Normal rate.   Pulmonary/Chest: He has wheezes.       Inspiratory and expiratory wheezes.   Abdominal: Soft. He exhibits no distension.  Musculoskeletal: Normal range of motion. He exhibits no edema.  Neurological: He is alert and oriented to person, place, and time.  Skin: Skin is warm and dry.  Psychiatric: He has a normal mood and affect. His behavior is normal.    ED Course  Procedures   DIAGNOSTIC STUDIES: Oxygen Saturation is 100% on room air, normal by my interpretation.    COORDINATION OF CARE:     Labs Reviewed - No data to display No results found.   No diagnosis found.    MDM  Patient with moderate asthma exacerbation with continued inspiratory and expiratory wheezing on initial evaluation here.  Patient already received Solu-Medrol by EMS.  We will continue  to give him some further nebulizer treatments here but I do anticipate the patient will improve enough to be discharged home.  I personally performed the services described in this documentation, which was scribed in my presence. The recorded information has been reviewed and considered.        Keith Christen, MD 03/06/12 [phone]

## 2012-03-06 NOTE — ED Provider Notes (Signed)
9:05 PM Patient is in CDU holding for nebulized breathing treatments.  Sign out received from Dr Golda Acre.  Pt with hx asthma and smoking, presenting with wheezing.  Pt improving with neb treatments, continues to have wheezing.  Currently getting hour long neb treatment.   On exam, pt is A&O, NAD, mildly tachycardic, diffuse expiratory wheezing throughout, moving air in all fields.  Will continue to follow.    10:40 PM Patient has finished his hour long neb treatment with minimal improvement.  O2 sat 95-96%.  Pt is moving air well in all fields but with very loud diffuse expiratory wheezes and mild inspiratory wheezes.  Have discussed with Dr Preston Fleeting who agrees with transition to overnight asthma protocol.  Patient also prefers this.  Anticipate d/c home in AM with albuterol inhaler, steroids, PCP follow up.   10:59 PM Patient signed out to Dr Theodoro Kalata who assumes care of patient overnight.    Keith Atkinson, [place] 03/06/12 2300

## 2012-03-06 NOTE — ED Notes (Signed)
Arrives by EMS, alert, NAD, calm, interactive, neb in progress, sitting upright, sent from bridge to stretcher triage 4 by CN. Staff notified.

## 2012-03-06 NOTE — ED Provider Notes (Signed)
History     CSN: [id number]  Arrival date & time 02/29/12  1608   First MD Initiated Contact with Patient 02/29/12 1648      Chief Complaint  Patient presents with  . Cough    (Consider location/radiation/quality/duration/timing/severity/associated sxs/prior treatment) HPI Comments: 51 y/o daily smoker male with h/o "asthma". Here c/o productive of yellow sputum fir 1 week associated with wheezing mostly at night time. No fever or chills. No chest pain. appetite ok.    Past Medical History  Diagnosis Date  . Asthma     History reviewed. No pertinent past surgical history.  No family history on file.  History  Substance Use Topics  . Smoking status: Current Everyday Smoker  . Smokeless tobacco: Not on file  . Alcohol Use: Yes      Review of Systems  Constitutional: Negative for fever, chills and appetite change.  HENT: Positive for congestion. Negative for sore throat and rhinorrhea.   Eyes: Negative for discharge.  Respiratory: Positive for cough and wheezing. Negative for shortness of breath.   Cardiovascular: Negative for chest pain.  Gastrointestinal: Negative for abdominal pain.  Musculoskeletal: Negative for arthralgias.  Skin: Negative for rash.  Neurological: Negative for dizziness and headaches.    Allergies  Review of patient's allergies indicates no known allergies.  Home Medications   Current Outpatient Rx  Name Route Sig Dispense Refill  . ALBUTEROL SULFATE HFA 108 (90 BASE) MCG/ACT IN AERS Inhalation Inhale 2 puffs into the lungs every 6 (six) hours as needed for wheezing or shortness of breath. 1 Inhaler 0  . AZITHROMYCIN 250 MG PO TABS Oral Take 1 tablet (250 mg total) by mouth daily. Take first 2 tablets together, then 1 every day until finished. 6 tablet 0  . PREDNISONE 20 MG PO TABS  2 tabs po daily for 5 days 10 tablet 0    BP 129/82  Pulse 68  Temp(Src) 98.3 F (36.8 C) (Oral)  Resp 22  SpO2 98%  Physical Exam  Nursing note and  vitals reviewed. Constitutional: He is oriented to person, place, and time. He appears well-developed and well-nourished. No distress.  HENT:  Head: Normocephalic and atraumatic.  Nose: Nose normal.  Mouth/Throat: Oropharynx is clear and moist.  Eyes: Conjunctivae are normal.  Neck: Neck supple.  Pulmonary/Chest: He has no wheezes. He has no rales. He exhibits no tenderness.       Bronchitic cough, bilateral scatered rhonchi.  Lymphadenopathy:    He has no cervical adenopathy.  Neurological: He is alert and oriented to person, place, and time.  Skin: No rash noted.    ED Course  Procedures (including critical care time)  Labs Reviewed - No data to display No results found.   1. Bronchitis       MDM  Encouraged to quit smoking. Treated with prednisone, albuterol and azithromycin.        Sharin Grave, MD 03/06/12 [phone]

## 2012-03-06 NOTE — ED Notes (Signed)
Respiratory called for breathing treatment.

## 2012-03-06 NOTE — ED Notes (Signed)
Respiratory at bedside for breathing treatment.

## 2012-03-06 NOTE — ED Notes (Signed)
[facility] , homes[facility], hx of asthma, onset 1700, called EMS 1900.  Initially high pitch I and Ex wheeze.  Received albuterol neb, 125 Solumedrol and comnivent neb afterwards.  MOving more air now,  Less labored.  No retractions

## 2012-03-06 NOTE — ED Notes (Signed)
Pt on continuous neb treatment, no change in breath sounds noted. Pt resting with eyes closed, easily arousable and denies any pain or shortness of breath at this time. Pt sats maintained at 100%, will continue to monitor pt.

## 2012-03-07 MED ORDER — ALBUTEROL SULFATE (5 MG/ML) 0.5% IN NEBU
2.5000 mg | INHALATION_SOLUTION | Freq: Once | RESPIRATORY_TRACT | Status: AC
  Administered 2012-03-07: 2.5 mg via RESPIRATORY_TRACT
  Filled 2012-03-07: qty 1

## 2012-03-07 MED ORDER — PREDNISONE 20 MG PO TABS: 60.0000 mg | ORAL_TABLET | Freq: Every day | ORAL | Status: AC

## 2012-03-07 MED ORDER — ALBUTEROL SULFATE HFA 108 (90 BASE) MCG/ACT IN AERS
2.0000 | INHALATION_SPRAY | Freq: Once | RESPIRATORY_TRACT | Status: AC
  Administered 2012-03-07: 2 via RESPIRATORY_TRACT
  Filled 2012-03-07: qty 6.7

## 2012-03-07 MED ORDER — IPRATROPIUM BROMIDE 0.02 % IN SOLN
0.5000 mg | Freq: Once | RESPIRATORY_TRACT | Status: AC
  Administered 2012-03-07: 0.5 mg via RESPIRATORY_TRACT
  Filled 2012-03-07: qty 2.5

## 2012-03-07 NOTE — Discharge Instructions (Signed)
Please use inhaler 1-2 puffs every 4-6 hours to help control asthma. Be sure to take all prednisone as prescribed as well. Return to emergency department for worsening symptoms  Asthma, Adult Asthma is caused by narrowing of the air passages in the lungs. It may be triggered by pollen, dust, animal dander, molds, some foods, respiratory infections, exposure to smoke, exercise, emotional stress or other allergens (things that cause allergic reactions or allergies). Repeat attacks are common. HOME CARE INSTRUCTIONS   Use prescription medications as ordered by your caregiver.   Avoid pollen, dust, animal dander, molds, smoke and other things that cause attacks at home and at work.   You may have fewer attacks if you decrease dust in your home. Electrostatic air cleaners may help.   It may help to replace your pillows or mattress with materials less likely to cause allergies.   Talk to your caregiver about an action plan for managing asthma attacks at home, including, the use of a peak flow meter which measures the severity of your asthma attack. An action plan can help minimize or stop the attack without having to seek medical care.   If you are not on a fluid restriction, drink 8 to 10 glasses of water each day.   Always have a plan prepared for seeking medical attention, including, calling your physician, accessing local emergency care, and calling 911 (in the [place] for a severe attack.   Discuss possible exercise routines with your caregiver.   If animal dander is the cause of asthma, you may need to get rid of pets.  SEEK MEDICAL CARE IF:   You have wheezing and shortness of breath even if taking medicine to prevent attacks.   You have muscle aches, chest pain or thickening of sputum.   Your sputum changes from clear or white to yellow, green, gray, or bloody.   You have any problems that may be related to the medicine you are taking (such as a rash, itching, swelling or trouble  breathing).  SEEK IMMEDIATE MEDICAL CARE IF:   Your usual medicines do not stop your wheezing or there is increased coughing and/or shortness of breath.   You have increased difficulty breathing.   You have a fever.  MAKE SURE YOU:   Understand these instructions.   Will watch your condition.   Will get help right away if you are not doing well or get worse.  Document Released: 09/21/2005 Document Revised: 09/10/2011 Document Reviewed: 05/09/2008 [facility] Patient Information 2012 [place], [place].

## 2012-03-07 NOTE — ED Provider Notes (Signed)
8:30 AM Patient does state or not on aspirin protocol. Continues to have diffuse wheezing. Will order one more albuterol treatments and reassess.  9:06 AM Patient has completed albuterol treatment. Mild wheezing in the right lower lobe. Significant improvement. Will discharge with albuterol inhaler and prednisone prescription. Patient reports a prescription filled. Advised return for worsening symptoms patient voices understanding and is ready for disc  Thomasene Lot, PA-C 03/07/12 0908

## 2012-03-07 NOTE — ED Notes (Signed)
Pt denies any acute distress.

## 2012-03-07 NOTE — ED Provider Notes (Signed)
Medical screening examination/treatment/procedure(s) were conducted as a shared visit with non-physician practitioner(s) and myself.  I personally evaluated the patient during the encounter   Keith Atkinson A Myrissa Chipley, MD 03/07/12 1505 

## 2012-03-07 NOTE — ED Provider Notes (Signed)
I was available throughout the CDU portion of the patient's course.  Gerhard Munch, MD 03/07/12 [phone]

## 2012-03-07 NOTE — ED Notes (Signed)
Peak flow of 250

## 2012-03-07 NOTE — ED Notes (Signed)
RESP AT BEDSIDE TO DO PEAK FLOW AND START NEB

## 2012-05-17 ENCOUNTER — Emergency Department (HOSPITAL_COMMUNITY): Payer: Self-pay

## 2012-05-17 ENCOUNTER — Emergency Department (HOSPITAL_COMMUNITY)
Admission: EM | Admit: 2012-05-17 | Discharge: 2012-05-17 | Disposition: A | Discharge status: Home / Self Care | Payer: Self-pay | Attending: Emergency Medicine | Admitting: Emergency Medicine

## 2012-05-17 ENCOUNTER — Encounter (HOSPITAL_COMMUNITY): Payer: Self-pay | Admitting: *Deleted

## 2012-05-17 DIAGNOSIS — F17200 Nicotine dependence, unspecified, uncomplicated: Secondary | ICD-10-CM | POA: Insufficient documentation

## 2012-05-17 DIAGNOSIS — J45901 Unspecified asthma with (acute) exacerbation: Secondary | ICD-10-CM

## 2012-05-17 DIAGNOSIS — J45909 Unspecified asthma, uncomplicated: Secondary | ICD-10-CM | POA: Insufficient documentation

## 2012-05-17 MED ORDER — ALBUTEROL SULFATE (5 MG/ML) 0.5% IN NEBU
5.0000 mg | INHALATION_SOLUTION | Freq: Once | RESPIRATORY_TRACT | Status: AC
  Administered 2012-05-17: 5 mg via RESPIRATORY_TRACT

## 2012-05-17 MED ORDER — PREDNISONE 20 MG PO TABS
60.0000 mg | ORAL_TABLET | Freq: Once | ORAL | Status: AC
  Administered 2012-05-17: 60 mg via ORAL
  Filled 2012-05-17: qty 3

## 2012-05-17 MED ORDER — ALBUTEROL SULFATE HFA 108 (90 BASE) MCG/ACT IN AERS
2.0000 | INHALATION_SPRAY | RESPIRATORY_TRACT | Status: DC | PRN
  Administered 2012-05-17: 2 via RESPIRATORY_TRACT
  Filled 2012-05-17: qty 6.7

## 2012-05-17 MED ORDER — PREDNISONE (PAK) 10 MG PO TABS: 40.0000 mg | ORAL_TABLET | Freq: Every day | ORAL | Status: AC

## 2012-05-17 MED ORDER — ALBUTEROL SULFATE (5 MG/ML) 0.5% IN NEBU
INHALATION_SOLUTION | RESPIRATORY_TRACT | Status: AC
  Filled 2012-05-17: qty 1

## 2012-05-17 NOTE — ED Provider Notes (Signed)
History     CSN: [id number]  Arrival date & time 05/17/12  1452   First MD Initiated Contact with Patient 05/17/12 1648      Chief Complaint  Patient presents with  . Asthma    (Consider location/radiation/quality/duration/timing/severity/associated sxs/prior treatment) Patient is a 51 y.o. male presenting with asthma. The history is provided by the patient and a friend. No language interpreter was used.  Asthma This is a new problem. The current episode started today. The problem occurs intermittently. The problem has been resolved. Pertinent negatives include no abdominal pain, chest pain, coughing, fever, nausea or vomiting. Exacerbated by: heat. He has tried nothing for the symptoms.   51 year old male with a past medical history of asthma started having wheezing when he was out in the hot sun today. States that he ran out of his inhaler last night. States that he uses his inhaler about twice today routinely. Denies fever cough or shortness of breath. Patient was given a breathing treatment in triage and his wheezing has resolved presently. We'll start him on prednisone and give him inhaler to go. Past medical history of asthma. It is a smoker.  Past Medical History  Diagnosis Date  . Asthma     History reviewed. No pertinent past surgical history.  No family history on file.  History  Substance Use Topics  . Smoking status: Current Everyday Smoker  . Smokeless tobacco: Not on file  . Alcohol Use: Yes      Review of Systems  Constitutional: Negative.  Negative for fever.  HENT: Negative.   Eyes: Negative.   Respiratory: Positive for wheezing. Negative for cough, chest tightness and shortness of breath.   Cardiovascular: Negative.  Negative for chest pain.  Gastrointestinal: Negative.  Negative for nausea, vomiting and abdominal pain.  Neurological: Negative.   Psychiatric/Behavioral: Negative.   All other systems reviewed and are negative.    Allergies  Review  of patient's allergies indicates no known allergies.  Home Medications   Current Outpatient Rx  Name Route Sig Dispense Refill  . ALBUTEROL SULFATE HFA 108 (90 BASE) MCG/ACT IN AERS Inhalation Inhale 2 puffs into the lungs every 6 (six) hours as needed. For shortness of breath    . FLUTICASONE-SALMETEROL 250-50 MCG/DOSE IN AEPB Inhalation Inhale 2 puffs into the lungs every 12 (twelve) hours.      BP 125/80  Pulse 91  Temp 98.6 F (37 C) (Oral)  Resp 16  SpO2 94%  Physical Exam  Nursing note and vitals reviewed. Constitutional: He is oriented to person, place, and time. He appears well-developed and well-nourished.  HENT:  Head: Normocephalic.  Eyes: Conjunctivae and EOM are normal. Pupils are equal, round, and reactive to light.  Neck: Normal range of motion. Neck supple.  Cardiovascular: Normal rate.   Pulmonary/Chest: Effort normal. He has wheezes. He exhibits no tenderness.  Abdominal: Soft. Bowel sounds are normal.  Musculoskeletal: Normal range of motion.  Neurological: He is alert and oriented to person, place, and time.  Skin: Skin is warm and dry.  Psychiatric: He has a normal mood and affect.    ED Course  Procedures (including critical care time)  Labs Reviewed - No data to display Dg Chest 2 View  05/17/2012  *RADIOLOGY REPORT*  Clinical Data: Asthma exacerbation with wheezing and shortness of breath.  CHEST - 2 VIEW  Comparison: 12/08/2011  Findings: Lungs are hyperinflated.  No edema, infiltrate, nodule or pneumothorax identified.  No pleural fluid.  Cardiac and mediastinal contours are  within normal limits.  Bony thorax is unremarkable.  IMPRESSION: Hyperinflation without focal infiltrate.  Original Report Authenticated By: Reola Calkins, M.D.     No diagnosis found.    MDM  51 year old male with past medical history of asthma here today with wheezing after being in the hot sun. Chest x-ray is unremarkable. He was out of his albuterol and we gave him  an inhaler. Also gave him a list of resources to get a PCP to followup with. Prednisone 60 in the ER and prescription for the same.VSS afebrile at discharge.        Remi Haggard, NP 05/18/12 1536

## 2012-05-17 NOTE — ED Notes (Signed)
Pt is here with exacerbation of asthma and has audible wheezing with some shortness of breath and out of inhaler

## 2012-05-17 NOTE — ED Notes (Signed)
Wait time advised.  No distress

## 2012-05-17 NOTE — ED Notes (Signed)
Pt reports he was having an asthma attack earlier, was given an breathing treatment in triage and now feels a lot better. Pt denies SOB. No wheezing heard. lung sounds clear.

## 2012-05-21 NOTE — ED Provider Notes (Signed)
Medical screening examination/treatment/procedure(s) were performed by non-physician practitioner and as supervising physician I was immediately available for consultation/collaboration.   Jensen Kilburg, MD 05/21/12 0651 

## 2015-03-12 ENCOUNTER — Emergency Department (HOSPITAL_COMMUNITY)
Admission: EM | Admit: 2015-03-12 | Discharge: 2015-03-13 | Disposition: A | Discharge status: Home / Self Care | Payer: Self-pay | Attending: Emergency Medicine | Admitting: Emergency Medicine

## 2015-03-12 ENCOUNTER — Encounter (HOSPITAL_COMMUNITY): Payer: Self-pay | Admitting: Emergency Medicine

## 2015-03-12 ENCOUNTER — Emergency Department (HOSPITAL_COMMUNITY): Payer: Self-pay

## 2015-03-12 DIAGNOSIS — Z7951 Long term (current) use of inhaled steroids: Secondary | ICD-10-CM | POA: Insufficient documentation

## 2015-03-12 DIAGNOSIS — J4521 Mild intermittent asthma with (acute) exacerbation: Secondary | ICD-10-CM | POA: Insufficient documentation

## 2015-03-12 DIAGNOSIS — Z79899 Other long term (current) drug therapy: Secondary | ICD-10-CM | POA: Insufficient documentation

## 2015-03-12 DIAGNOSIS — M5410 Radiculopathy, site unspecified: Secondary | ICD-10-CM | POA: Insufficient documentation

## 2015-03-12 DIAGNOSIS — Z720 Tobacco use: Secondary | ICD-10-CM | POA: Insufficient documentation

## 2015-03-12 LAB — BASIC METABOLIC PANEL
Anion gap: 7 (ref 5–15)
BUN: 13 mg/dL (ref 6–20)
CO2: 26 mmol/L (ref 22–32)
Calcium: 9.2 mg/dL (ref 8.9–10.3)
Chloride: 106 mmol/L (ref 101–111)
Creatinine, Ser: 1.22 mg/dL (ref 0.61–1.24)
GFR calc Af Amer: >60 mL/min (ref >60)
Glucose, Bld: 78 mg/dL (ref 65–99)
POTASSIUM: 4.1 mmol/L (ref 3.5–5.1)
Sodium: 139 mmol/L (ref 135–145)

## 2015-03-12 LAB — CBC
HCT: 45.1 % (ref 39.0–52.0)
HEMOGLOBIN: 15.5 g/dL (ref 13.0–17.0)
MCH: 31.1 pg (ref 26.0–34.0)
MCHC: 34.4 g/dL (ref 30.0–36.0)
MCV: 90.6 fL (ref 78.0–100.0)
PLATELETS: 268 10*3/uL (ref 150–400)
RBC: 4.98 MIL/uL (ref 4.22–5.81)
RDW: 13.4 % (ref 11.5–15.5)
WBC: 4.2 10*3/uL (ref 4.0–10.5)

## 2015-03-12 LAB — I-STAT TROPONIN, ED: Troponin i, poc: 0 ng/mL (ref 0.00–0.08)

## 2015-03-12 MED ORDER — KETOROLAC TROMETHAMINE 30 MG/ML IJ SOLN
30.0000 mg | Freq: Once | INTRAMUSCULAR | Status: AC
  Administered 2015-03-12: 30 mg via INTRAVENOUS
  Filled 2015-03-12: qty 1

## 2015-03-12 MED ORDER — IPRATROPIUM-ALBUTEROL 0.5-2.5 (3) MG/3ML IN SOLN
3.0000 mL | RESPIRATORY_TRACT | Status: DC
  Administered 2015-03-12: 3 mL via RESPIRATORY_TRACT
  Filled 2015-03-12: qty 3

## 2015-03-12 MED ORDER — CYCLOBENZAPRINE HCL 10 MG PO TABS: 10.0000 mg | ORAL_TABLET | Freq: Two times a day (BID) | ORAL | Status: DC | PRN

## 2015-03-12 MED ORDER — IBUPROFEN 600 MG PO TABS: 600.0000 mg | ORAL_TABLET | Freq: Four times a day (QID) | ORAL | Status: DC | PRN

## 2015-03-12 MED ORDER — ALBUTEROL SULFATE HFA 108 (90 BASE) MCG/ACT IN AERS
2.0000 | INHALATION_SPRAY | Freq: Once | RESPIRATORY_TRACT | Status: AC
Start: 2015-03-12 — End: 2015-03-12
  Administered 2015-03-12: 2 via RESPIRATORY_TRACT
  Filled 2015-03-12: qty 6.7

## 2015-03-12 MED ORDER — CYCLOBENZAPRINE HCL 10 MG PO TABS
10.0000 mg | ORAL_TABLET | Freq: Once | ORAL | Status: AC
  Administered 2015-03-12: 10 mg via ORAL
  Filled 2015-03-12: qty 1

## 2015-03-12 NOTE — ED Notes (Signed)
Per ems-- pt c.o R scapula pain radiating down R arm with numbness/tingling since yesterday. Pain is worse with exertion. Stroke scale negative. Pt given 5mg  albuterol pta d/t wheezes to R side. EKG showed abnormality.

## 2015-03-12 NOTE — ED Notes (Addendum)
Pt sts when he moves his neck he gets pain down shoulder. Pain also increases with palpation. Pt has also been out of albuterol inhaler for several days.

## 2015-03-12 NOTE — Discharge Instructions (Signed)
Asthma Attack Prevention Although there is no way to prevent asthma from starting, you can take steps to control the disease and reduce its symptoms. Learn about your asthma and how to control it. Take an active role to control your asthma by working with your health care provider to create and follow an asthma action plan. An asthma action plan guides you in:  Taking your medicines properly.  Avoiding things that set off your asthma or make your asthma worse (asthma triggers).  Tracking your level of asthma control.  Responding to worsening asthma.  Seeking emergency care when needed. To track your asthma, keep records of your symptoms, check your peak flow number using a handheld device that shows how well air moves out of your lungs (peak flow meter), and get regular asthma checkups.  WHAT ARE SOME WAYS TO PREVENT AN ASTHMA ATTACK?  Take medicines as directed by your health care provider.  Keep track of your asthma symptoms and level of control.  With your health care provider, write a detailed plan for taking medicines and managing an asthma attack. Then be sure to follow your action plan. Asthma is an ongoing condition that needs regular monitoring and treatment.  Identify and avoid asthma triggers. Many outdoor allergens and irritants (such as pollen, mold, cold air, and air pollution) can trigger asthma attacks. Find out what your asthma triggers are and take steps to avoid them.  Monitor your breathing. Learn to recognize warning signs of an attack, such as coughing, wheezing, or shortness of breath. Your lung function may decrease before you notice any signs or symptoms, so regularly measure and record your peak airflow with a home peak flow meter.  Identify and treat attacks early. If you act quickly, you are less likely to have a severe attack. You will also need less medicine to control your symptoms. When your peak flow measurements decrease and alert you to an upcoming attack,  take your medicine as instructed and immediately stop any activity that may have triggered the attack. If your symptoms do not improve, get medical help.  Pay attention to increasing quick-relief inhaler use. If you find yourself relying on your quick-relief inhaler, your asthma is not under control. See your health care provider about adjusting your treatment. WHAT CAN MAKE MY SYMPTOMS WORSE? A number of common things can set off or make your asthma symptoms worse and cause temporary increased inflammation of your airways. Keep track of your asthma symptoms for several weeks, detailing all the environmental and emotional factors that are linked with your asthma. When you have an asthma attack, go back to your asthma diary to see which factor, or combination of factors, might have contributed to it. Once you know what these factors are, you can take steps to control many of them. If you have allergies and asthma, it is important to take asthma prevention steps at home. Minimizing contact with the substance to which you are allergic will help prevent an asthma attack. Some triggers and ways to avoid these triggers are: Animal Dander:  Some people are allergic to the flakes of skin or dried saliva from animals with fur or feathers.   There is no such thing as a hypoallergenic dog or cat breed. All dogs or cats can cause allergies, even if they don't shed.  Keep these pets out of your home.  If you are not able to keep a pet outdoors, keep the pet out of your bedroom and other sleeping areas at all  times, and keep the door closed. °· Remove carpets and furniture covered with cloth from your home. If that is not possible, keep the pet away from fabric-covered furniture and carpets. °Dust Mites: °Many people with asthma are allergic to dust mites. Dust mites are tiny bugs that are found in every home in mattresses, pillows, carpets, fabric-covered furniture, bedcovers, clothes, stuffed toys, and other  fabric-covered items.  °· Cover your mattress in a special dust-proof cover. °· Cover your pillow in a special dust-proof cover, or wash the pillow each week in hot water. Water must be hotter than 130° F (54.4° C) to kill dust mites. Cold or warm water used with detergent and bleach can also be effective. °· Wash the sheets and blankets on your bed each week in hot water. °· Try not to sleep or lie on cloth-covered cushions. °· Call ahead when traveling and ask for a smoke-free hotel room. Bring your own bedding and pillows in case the hotel only supplies feather pillows and down comforters, which may contain dust mites and cause asthma symptoms. °· Remove carpets from your bedroom and those laid on concrete, if you can. °· Keep stuffed toys out of the bed, or wash the toys weekly in hot water or cooler water with detergent and bleach. °Cockroaches: °Many people with asthma are allergic to the droppings and remains of cockroaches.  °· Keep food and garbage in closed containers. Never leave food out. °· Use poison baits, traps, powders, gels, or paste (for example, boric acid). °· If a spray is used to kill cockroaches, stay out of the room until the odor goes away. °Indoor Mold: °· Fix leaky faucets, pipes, or other sources of water that have mold around them. °· Clean floors and moldy surfaces with a fungicide or diluted bleach. °· Avoid using humidifiers, vaporizers, or swamp coolers. These can spread molds through the air. °Pollen and Outdoor Mold: °· When pollen or mold spore counts are high, try to keep your windows closed. °· Stay indoors with windows closed from late morning to afternoon. Pollen and some mold spore counts are highest at that time. °· Ask your health care provider whether you need to take anti-inflammatory medicine or increase your dose of the medicine before your allergy season starts. °Other Irritants to Avoid: °· Tobacco smoke is an irritant. If you smoke, ask your health care provider how  you can quit. Ask family members to quit smoking, too. Do not allow smoking in your home or car. °· If possible, do not use a wood-burning stove, kerosene heater, or fireplace. Minimize exposure to all sources of smoke, including incense, candles, fires, and fireworks. °· Try to stay away from strong odors and sprays, such as perfume, talcum powder, hair spray, and paints. °· Decrease humidity in your home and use an indoor air cleaning device. Reduce indoor humidity to below 60%. Dehumidifiers or central air conditioners can do this. °· Decrease house dust exposure by changing furnace and air cooler filters frequently. °· Try to have someone else vacuum for you once or twice a week. Stay out of rooms while they are being vacuumed and for a short while afterward. °· If you vacuum, use a dust mask from a hardware store, a double-layered or microfilter vacuum cleaner bag, or a vacuum cleaner with a HEPA filter. °· Sulfites in foods and beverages can be irritants. Do not drink beer or wine or eat dried fruit, processed potatoes, or shrimp if they cause asthma symptoms. °· Cold   air can trigger an asthma attack. Cover your nose and mouth with a scarf on cold or windy days.  Several health conditions can make asthma more difficult to manage, including a runny nose, sinus infections, reflux disease, psychological stress, and sleep apnea. Work with your health care provider to manage these conditions.  Avoid close contact with people who have a respiratory infection such as a cold or the flu, since your asthma symptoms may get worse if you catch the infection. Wash your hands thoroughly after touching items that may have been handled by people with a respiratory infection.  Get a flu shot every year to protect against the flu virus, which often makes asthma worse for days or weeks. Also get a pneumonia shot if you have not previously had one. Unlike the flu shot, the pneumonia shot does not need to be given  yearly. Medicines:  Talk to your health care provider about whether it is safe for you to take aspirin or non-steroidal anti-inflammatory medicines (NSAIDs). In a small number of people with asthma, aspirin and NSAIDs can cause asthma attacks. These medicines must be avoided by people who have known aspirin-sensitive asthma. It is important that people with aspirin-sensitive asthma read labels of all over-the-counter medicines used to treat pain, colds, coughs, and fever.  Beta-blockers and ACE inhibitors are other medicines you should discuss with your health care provider. HOW CAN I FIND OUT WHAT I AM ALLERGIC TO? Ask your asthma health care provider about allergy skin testing or blood testing (the RAST test) to identify the allergens to which you are sensitive. If you are found to have allergies, the most important thing to do is to try to avoid exposure to any allergens that you are sensitive to as much as possible. Other treatments for allergies, such as medicines and allergy shots (immunotherapy) are available.  CAN I EXERCISE? Follow your health care provider's advice regarding asthma treatment before exercising. It is important to maintain a regular exercise program, but vigorous exercise or exercise in cold, humid, or dry environments can cause asthma attacks, especially for those people who have exercise-induced asthma. Document Released: 09/09/2009 Document Revised: 09/26/2013 Document Reviewed: 03/29/2013 [facility] Patient Information 2015 [place], [place]. This information is not intended to replace advice given to you by your health care provider. Make sure you discuss any questions you have with your health care provider. Cervical Radiculopathy Cervical radiculopathy happens when a nerve in the neck is pinched or bruised by a slipped (herniated) disk or by arthritic changes in the bones of the cervical spine. This can occur due to an injury or as part of the normal aging process. Pressure  on the cervical nerves can cause pain or numbness that runs from your neck all the way down into your arm and fingers. CAUSES  There are many possible causes, including:  Injury.  Muscle tightness in the neck from overuse.  Swollen, painful joints (arthritis).  Breakdown or degeneration in the bones and joints of the spine (spondylosis) due to aging.  Bone spurs that may develop near the cervical nerves. SYMPTOMS  Symptoms include pain, weakness, or numbness in the affected arm and hand. Pain can be severe or irritating. Symptoms may be worse when extending or turning the neck. DIAGNOSIS  Your caregiver will ask about your symptoms and do a physical exam. He or she may test your strength and reflexes. X-rays, CT scans, and MRI scans may be needed in cases of injury or if the symptoms do not  go away after a period of time. Electromyography (EMG) or nerve conduction testing may be done to study how your nerves and muscles are working. TREATMENT  Your caregiver may recommend certain exercises to help relieve your symptoms. Cervical radiculopathy can, and often does, get better with time and treatment. If your problems continue, treatment options may include:  Wearing a soft collar for short periods of time.  Physical therapy to strengthen the neck muscles.  Medicines, such as nonsteroidal anti-inflammatory drugs (NSAIDs), oral corticosteroids, or spinal injections.  Surgery. Different types of surgery may be done depending on the cause of your problems. HOME CARE INSTRUCTIONS   Put ice on the affected area.  Put ice in a plastic bag.  Place a towel between your skin and the bag.  Leave the ice on for 15-20 minutes, 03-04 times a day or as directed by your caregiver.  If ice does not help, you can try using heat. Take a warm shower or bath, or use a hot water bottle as directed by your caregiver.  You may try a gentle neck and shoulder massage.  Use a flat pillow when you  sleep.  Only take over-the-counter or prescription medicines for pain, discomfort, or fever as directed by your caregiver.  If physical therapy was prescribed, follow your caregiver's directions.  If a soft collar was prescribed, use it as directed. SEEK IMMEDIATE MEDICAL CARE IF:   Your pain gets much worse and cannot be controlled with medicines.  You have weakness or numbness in your hand, arm, face, or leg.  You have a high fever or a stiff, rigid neck.  You lose bowel or bladder control (incontinence).  You have trouble with walking, balance, or speaking. MAKE SURE YOU:   Understand these instructions.  Will watch your condition.  Will get help right away if you are not doing well or get worse. Document Released: 06/16/2001 Document Revised: 12/14/2011 Document Reviewed: 05/05/2011 [facility] Patient Information 2015 [place], [place]. This information is not intended to replace advice given to you by your health care provider. Make sure you discuss any questions you have with your health care provider.

## 2015-03-12 NOTE — ED Notes (Signed)
Pt transported to radiology.

## 2015-03-12 NOTE — ED Provider Notes (Signed)
CSN: [id number]     Arrival date & time 03/12/15  2206 History   First MD Initiated Contact with Patient 03/12/15 2213     Chief Complaint  Patient presents with  . Shoulder Pain     (Consider location/radiation/quality/duration/timing/severity/associated sxs/prior Treatment) HPI Patient states pain in right base of neck radiating to shoulder and causing tingling and pain to radiate down his arm. This made worse by certain movements. It started yesterday evening and he was unable to find a comfortable position to sleep in. The patient does not know of any particular injury. He also reports he has had some shortness of breath for a couple days. He states he thinks is because he ran out of his albuterol inhaler several days ago. He reports that it feels like his asthma. The patient does state that he smokes as well. Had some cough but no fever no chest pain. Past Medical History  Diagnosis Date  . Asthma    History reviewed. No pertinent past surgical history. No family history on file. History  Substance Use Topics  . Smoking status: Current Every Day Smoker  . Smokeless tobacco: Not on file  . Alcohol Use: Yes    Review of Systems  10 Systems reviewed and are negative for acute change except as noted in the HPI.   Allergies  Review of patient's allergies indicates no known allergies.  Home Medications   Prior to Admission medications   Medication Sig Start Date End Date Taking? Authorizing Provider  albuterol (PROVENTIL HFA;VENTOLIN HFA) 108 (90 BASE) MCG/ACT inhaler Inhale 1 puff into the lungs every 6 (six) hours as needed for wheezing or shortness of breath.   Yes Historical Provider, MD  Fluticasone-Salmeterol (ADVAIR) 250-50 MCG/DOSE AEPB Inhale 2 puffs into the lungs every 12 (twelve) hours.   Yes Historical Provider, MD  cyclobenzaprine (FLEXERIL) 10 MG tablet Take 1 tablet (10 mg total) by mouth 2 (two) times daily as needed for muscle spasms. 03/12/15   Arby BarretteMarcy Braylyn Kalter, MD   ibuprofen (ADVIL,MOTRIN) 600 MG tablet Take 1 tablet (600 mg total) by mouth every 6 (six) hours as needed. 03/12/15   Arby BarretteMarcy Wilkes Potvin, MD   BP 142/85 mmHg  Pulse 69  Temp(Src) 96.9 F (36.1 C) (Oral)  Resp 19  Ht 5\' 10"  (1.778 m)  Wt 147 lb (66.679 kg)  BMI 21.09 kg/m2  SpO2 98% Physical Exam  Constitutional: He is oriented to person, place, and time. He appears well-developed and well-nourished.  HENT:  Head: Normocephalic and atraumatic.  Eyes: EOM are normal. Pupils are equal, round, and reactive to light.  Neck: Neck supple.  Patient has reproducible pain to palpation in the trapezius body in the paraspinous cervical region on the right.  Cardiovascular: Normal rate, regular rhythm, normal heart sounds and intact distal pulses.   Pulmonary/Chest: Effort normal. He has wheezes.  Abdominal: Soft. Bowel sounds are normal. He exhibits no distension. There is no tenderness.  Musculoskeletal: Normal range of motion. He exhibits no edema.  Neurological: He is alert and oriented to person, place, and time. He has normal strength. Coordination normal. GCS eye subscore is 4. GCS verbal subscore is 5. GCS motor subscore is 6.  Skin: Skin is warm, dry and intact.  Psychiatric: He has a normal mood and affect.    ED Course  Procedures (including critical care time) Labs Review Labs Reviewed  CBC  BASIC METABOLIC PANEL  Rosezena SensorI-STAT TROPOININ, ED    Imaging Review Dg Chest 2 View  03/12/2015  CLINICAL DATA:  Shortness of breath and wheezing for 2 days. History of asthma.  EXAM: CHEST  2 VIEW  COMPARISON:  05/17/2012  FINDINGS: Again noted is hyperinflation and compatible with history of asthma. No focal airspace disease. Heart and mediastinum are within normal limits. Trachea is midline. No large pleural effusions.  IMPRESSION: Stable hyperinflation without acute findings.   Electronically Signed   By: Richarda Overlie M.D.   On: 03/12/2015 23:02     EKG Interpretation   Date/Time:  Tuesday  March 12 2015 22:10:56 EDT Ventricular Rate:  69 PR Interval:  147 QRS Duration: 104 QT Interval:  408 QTC Calculation: 437 R Axis:   98 Text Interpretation:  Sinus rhythm Borderline right axis deviation RSR' in  V1 or V2, probably normal variant ST elev, probable normal early repol  pattern Baseline wander in lead(s) II agree, c/w old. Confirmed by  Donnald Garre, MD, Lebron Conners [phone]) on 03/12/2015 10:14:34 PM      MDM   Final diagnoses:  Radiculopathy affecting upper extremity  Asthma, mild intermittent, with acute exacerbation   Patient resistance symptoms of cervical radiculopathy. He has reproducible skeletal pain that is worse with movement and radiates down his arms with a paresthesia quality. There is no neurologic weakness associated. He also presents with shortness of breath however he reports he has been out of his inhaler for several days and does smoke as well. The patient with right with an inhaler. He is advised to follow-up with his family physician.    Arby Barrette, MD 03/12/15 [phone]

## 2017-05-16 ENCOUNTER — Emergency Department (HOSPITAL_COMMUNITY)
Admission: EM | Admit: 2017-05-16 | Discharge: 2017-05-16 | Disposition: A | Discharge status: Home / Self Care | Payer: Self-pay | Attending: Physician Assistant | Admitting: Physician Assistant

## 2017-05-16 ENCOUNTER — Emergency Department (HOSPITAL_COMMUNITY): Payer: Self-pay

## 2017-05-16 ENCOUNTER — Encounter (HOSPITAL_COMMUNITY): Payer: Self-pay | Admitting: Emergency Medicine

## 2017-05-16 DIAGNOSIS — Z720 Tobacco use: Secondary | ICD-10-CM

## 2017-05-16 DIAGNOSIS — F17210 Nicotine dependence, cigarettes, uncomplicated: Secondary | ICD-10-CM | POA: Insufficient documentation

## 2017-05-16 DIAGNOSIS — R05 Cough: Secondary | ICD-10-CM | POA: Insufficient documentation

## 2017-05-16 DIAGNOSIS — J45901 Unspecified asthma with (acute) exacerbation: Secondary | ICD-10-CM | POA: Insufficient documentation

## 2017-05-16 DIAGNOSIS — Z9114 Patient's other noncompliance with medication regimen: Secondary | ICD-10-CM | POA: Insufficient documentation

## 2017-05-16 DIAGNOSIS — R509 Fever, unspecified: Secondary | ICD-10-CM | POA: Insufficient documentation

## 2017-05-16 LAB — CBC WITH DIFFERENTIAL/PLATELET
BASOS PCT: 1 %
Basophils Absolute: 0 10*3/uL (ref 0.0–0.1)
EOS ABS: 0.1 10*3/uL (ref 0.0–0.7)
EOS PCT: 1 %
HCT: 42.8 % (ref 39.0–52.0)
Hemoglobin: 14.8 g/dL (ref 13.0–17.0)
LYMPHS PCT: 16 %
Lymphs Abs: 0.8 10*3/uL (ref 0.7–4.0)
MCH: 30.8 pg (ref 26.0–34.0)
MCHC: 34.6 g/dL (ref 30.0–36.0)
MCV: 89 fL (ref 78.0–100.0)
Monocytes Absolute: 0.1 10*3/uL (ref 0.1–1.0)
Monocytes Relative: 3 %
Neutro Abs: 3.9 10*3/uL (ref 1.7–7.7)
Neutrophils Relative %: 79 %
Platelets: 236 10*3/uL (ref 150–400)
RBC: 4.81 MIL/uL (ref 4.22–5.81)
RDW: 13.4 % (ref 11.5–15.5)
WBC: 4.9 10*3/uL (ref 4.0–10.5)

## 2017-05-16 LAB — BASIC METABOLIC PANEL
Anion gap: 8 (ref 5–15)
BUN: 17 mg/dL (ref 6–20)
CO2: 28 mmol/L (ref 22–32)
Calcium: 10 mg/dL (ref 8.9–10.3)
Chloride: 103 mmol/L (ref 101–111)
Creatinine, Ser: 1.2 mg/dL (ref 0.61–1.24)
GFR calc Af Amer: >60 mL/min (ref >60)
Glucose, Bld: 146 mg/dL — ABNORMAL HIGH (ref 65–99)
POTASSIUM: 4.4 mmol/L (ref 3.5–5.1)
Sodium: 139 mmol/L (ref 135–145)

## 2017-05-16 MED ORDER — PREDNISONE 50 MG PO TABS: ORAL_TABLET | ORAL | 0 refills | Status: DC

## 2017-05-16 MED ORDER — ALBUTEROL (5 MG/ML) CONTINUOUS INHALATION SOLN
INHALATION_SOLUTION | RESPIRATORY_TRACT | Status: AC
  Filled 2017-05-16: qty 20

## 2017-05-16 MED ORDER — ALBUTEROL SULFATE HFA 108 (90 BASE) MCG/ACT IN AERS
2.0000 | INHALATION_SPRAY | Freq: Once | RESPIRATORY_TRACT | Status: AC
  Administered 2017-05-16: 2 via RESPIRATORY_TRACT
  Filled 2017-05-16: qty 6.7

## 2017-05-16 MED ORDER — IPRATROPIUM-ALBUTEROL 0.5-2.5 (3) MG/3ML IN SOLN
3.0000 mL | Freq: Once | RESPIRATORY_TRACT | Status: AC
  Administered 2017-05-16: 3 mL via RESPIRATORY_TRACT
  Filled 2017-05-16: qty 3

## 2017-05-16 MED ORDER — ALBUTEROL (5 MG/ML) CONTINUOUS INHALATION SOLN
15.0000 mg/h | INHALATION_SOLUTION | Freq: Once | RESPIRATORY_TRACT | Status: AC
  Administered 2017-05-16: 15 mg/h via RESPIRATORY_TRACT

## 2017-05-16 NOTE — ED Provider Notes (Signed)
[facility]-EMERGENCY DEPT Provider Note   CSN: [id number] Arrival date & time: 05/16/17  2044     History   Chief Complaint Chief Complaint  Patient presents with  . Asthma    HPI   Blood pressure (!) 150/99, pulse 73, temperature 98.3 F (36.8 C), temperature source Oral, resp. rate (!) 25, SpO2 97 %.  Keith Atkinson is a 56 y.o. male with past medical history significant for asthma, active daily smoker stating that he's been out of his inhalers for several months, he does not have a primary care and cannot afford his medications. States that he's been having some shortness of breath and decided to have it evaluated today. There is no anterior chest pain, cough above his baseline, fever, chills. Give 125 of solumedrol, and albuterol, minimal improvement in symptoms.  Past Medical History:  Diagnosis Date  . Asthma     There are no active problems to display for this patient.   History reviewed. No pertinent surgical history.     Home Medications    Prior to Admission medications   Medication Sig Start Date End Date Taking? Authorizing Provider  cyclobenzaprine (FLEXERIL) 10 MG tablet Take 1 tablet (10 mg total) by mouth 2 (two) times daily as needed for muscle spasms. Patient not taking: Reported on 05/16/2017 03/12/15   Arby Barrette, MD  ibuprofen (ADVIL,MOTRIN) 600 MG tablet Take 1 tablet (600 mg total) by mouth every 6 (six) hours as needed. Patient not taking: Reported on 05/16/2017 03/12/15   Arby Barrette, MD  predniSONE (DELTASONE) 50 MG tablet Take 1 tablet daily with breakfast 05/16/17   [name], [name], PA-C    Family History No family history on file.  Social History Social History  Substance Use Topics  . Smoking status: Current Every Day Smoker  . Smokeless tobacco: Not on file  . Alcohol use Yes     Allergies   Patient has no known allergies.   Review of Systems Review of Systems  A complete review of systems was obtained and all systems are  negative except as noted in the HPI and PMH.   Physical Exam Updated Vital Signs BP (!) 148/83   Pulse 98   Temp 98.3 F (36.8 C) (Oral)   Resp (!) 24   SpO2 91%   Physical Exam  Constitutional: He appears well-developed and well-nourished.  HENT:  Head: Normocephalic.  Eyes: Conjunctivae are normal.  Neck: Normal range of motion.  Cardiovascular: Normal rate, regular rhythm and intact distal pulses.  Exam reveals no gallop and no friction rub.   No murmur heard. Pulmonary/Chest: Effort normal. No respiratory distress. He has wheezes. He has no rales. He exhibits no tenderness.  Diffuse expiratory wheezing in all fields, excellent air movement, patient is speaking in complete sentences, no accessory muscle use.  Abdominal: Soft. He exhibits no distension and no mass. There is no tenderness. There is no rebound and no guarding. No hernia.  Neurological: He is alert.  No point tenderness to percussion of lumbar spinal processes.  No TTP or paraspinal muscular spasm. Strength is 5 out of 5 to bilateral lower extremities at hip and knee; extensor hallucis longus 5 out of 5. Ankle strength 5 out of 5, no clonus, neurovascularly intact. No saddle anaesthesia. Patellar reflexes are 2+ bilaterally.     Skin: Capillary refill takes less than 2 seconds.  Psychiatric: He has a normal mood and affect.  Nursing note and vitals reviewed.    ED Treatments / Results  Labs (  all labs ordered are listed, but only abnormal results are displayed) Labs Reviewed  BASIC METABOLIC PANEL - Abnormal; Notable for the following:       Result Value   Glucose, Bld 146 (*)    All other components within normal limits  CBC WITH DIFFERENTIAL/PLATELET    EKG  EKG Interpretation  Date/Time:  Sunday May 16 2017 20:45:59 EDT Ventricular Rate:  69 PR Interval:    QRS Duration: 116 QT Interval:  420 QTC Calculation: 450 R Axis:   82 Text Interpretation:  Sinus rhythm Consider left atrial enlargement  Incomplete right bundle branch block ST elevation suggests acute pericarditis Normal sinus rhythm Confirmed by Bary Castilla ([id number]) on 05/16/2017 9:31:29 PM       Radiology Dg Chest 2 View  Result Date: 05/16/2017 CLINICAL DATA:  Acute onset of shortness of breath. Initial encounter. EXAM: CHEST  2 VIEW COMPARISON:  Chest radiograph performed 03/12/2015 FINDINGS: The lungs are well-aerated and clear. There is no evidence of focal opacification, pleural effusion or pneumothorax. The heart is normal in size; the mediastinal contour is within normal limits. No acute osseous abnormalities are seen. IMPRESSION: No acute cardiopulmonary process seen. Electronically Signed   By: [name] M.D.   On: 05/16/2017 21:59    Procedures Procedures (including critical care time)  Medications Ordered in ED Medications  albuterol (PROVENTIL HFA;VENTOLIN HFA) 108 (90 Base) MCG/ACT inhaler 2 puff (not administered)  ipratropium-albuterol (DUONEB) 0.5-2.5 (3) MG/3ML nebulizer solution 3 mL (3 mLs Nebulization Given 05/16/17 2132)  albuterol (PROVENTIL,VENTOLIN) solution continuous neb (15 mg/hr Nebulization Given 05/16/17 2223)     Initial Impression / Assessment and Plan / ED Course  I have reviewed the triage vital signs and the nursing notes.  Pertinent labs & imaging results that were available during my care of the patient were reviewed by me and considered in my medical decision making (see chart for details).     Vitals:   05/16/17 2245 05/16/17 2300 05/16/17 2315 05/16/17 2330  BP: 139/90 (!) 145/84 (!) 155/81 (!) 148/83  Pulse: 84 88 98 98  Resp: 18 19 (!) 21 (!) 24  Temp:      TempSrc:      SpO2: 92% 92% 92% 91%    Medications  albuterol (PROVENTIL HFA;VENTOLIN HFA) 108 (90 Base) MCG/ACT inhaler 2 puff (not administered)  ipratropium-albuterol (DUONEB) 0.5-2.5 (3) MG/3ML nebulizer solution 3 mL (3 mLs Nebulization Given 05/16/17 2132)  albuterol (PROVENTIL,VENTOLIN) solution  continuous neb (15 mg/hr Nebulization Given 05/16/17 2223)    Keith Atkinson is 56 y.o. male presenting with Persistent shortness of breath worsening over the course of last several months, not acutely worsening today. Patient with diffuse expiratory wheezing, counseled him for greater than 10 minutes on smoking cessation. Patient was given Solu-Medrol in route, DuoNeb administered. Will check basic blood work and chest x-ray. Triage initiated EKG without any acute findings, doubt pericarditis as patient is not reporting any chest pain.  After hour-long nebulizer wheezing has significantly improved, and comfortable and amenable to discharge. We've had an extensive discussion of return precautions and patient verbalizes understanding and teach back technique, I put in a consult case management so hopefully this patient can be contacted and given a primary care appointment as an outpatient.  Evaluation does not show pathology that would require ongoing emergent intervention or inpatient treatment. Pt is hemodynamically stable and mentating appropriately. Discussed findings and plan with patient/guardian, who agrees with care plan. All questions answered. Return precautions discussed and outpatient  follow up given.    Final Clinical Impressions(s) / ED Diagnoses   Final diagnoses:  Exacerbation of asthma, unspecified asthma severity, unspecified whether persistent  Tobacco use    New Prescriptions New Prescriptions   PREDNISONE (DELTASONE) 50 MG TABLET    Take 1 tablet daily with breakfast     Kaylyn Limisciotta, Rishav Rockefeller, PA-C 05/16/17 2355    Abelino DerrickMackuen, Courteney Lyn, MD 05/19/17 1053

## 2017-05-16 NOTE — Discharge Instructions (Signed)
Do not hesitate to return to the Emergency Department for any new, worsening or concerning symptoms.  ° °If you do not have a primary care doctor you can establish one at the  ° °[facility]: °[address] °[place] [address] °[phone] ° °After you establish care. Let them know you were seen in the emergency room. They must obtain records for further management.  ° ° °

## 2017-05-16 NOTE — ED Notes (Signed)
Pt reports being out of his medications for his asthma for sometime now. Pt here for medications.

## 2017-05-16 NOTE — ED Triage Notes (Signed)
Pt BIB [facility] for shortness of breath that has been going on and getting progressively getting worse. EMS administered 15mg  albuterol, 1mg  of atrovent, 125mg  solumedrol. Pt has ran out of his albuterol inhaler.

## 2017-05-17 ENCOUNTER — Telehealth: Payer: Self-pay | Admitting: *Deleted

## 2017-05-17 NOTE — Telephone Encounter (Signed)
EDCM attempted to contact pt for best time to set up PCP appointment and there was no answer.  [facility] that of [facility] which ususally indicates pt is homeless.

## 2017-10-09 ENCOUNTER — Emergency Department (HOSPITAL_COMMUNITY): Payer: Medicaid Other

## 2017-10-09 ENCOUNTER — Emergency Department (HOSPITAL_COMMUNITY)
Admission: EM | Admit: 2017-10-09 | Discharge: 2017-10-09 | Disposition: A | Discharge status: Home / Self Care | Payer: Medicaid Other | Attending: Emergency Medicine | Admitting: Emergency Medicine

## 2017-10-09 ENCOUNTER — Other Ambulatory Visit: Payer: Self-pay

## 2017-10-09 ENCOUNTER — Encounter (HOSPITAL_COMMUNITY): Payer: Self-pay | Admitting: *Deleted

## 2017-10-09 ENCOUNTER — Emergency Department (HOSPITAL_BASED_OUTPATIENT_CLINIC_OR_DEPARTMENT_OTHER): Admit: 2017-10-09 | Discharge: 2017-10-09 | Disposition: A | Discharge status: Home / Self Care | Payer: Medicaid Other

## 2017-10-09 DIAGNOSIS — R2241 Localized swelling, mass and lump, right lower limb: Secondary | ICD-10-CM | POA: Diagnosis not present

## 2017-10-09 DIAGNOSIS — G8929 Other chronic pain: Secondary | ICD-10-CM | POA: Diagnosis not present

## 2017-10-09 DIAGNOSIS — M7989 Other specified soft tissue disorders: Secondary | ICD-10-CM

## 2017-10-09 DIAGNOSIS — M25561 Pain in right knee: Secondary | ICD-10-CM | POA: Insufficient documentation

## 2017-10-09 DIAGNOSIS — Z79899 Other long term (current) drug therapy: Secondary | ICD-10-CM | POA: Insufficient documentation

## 2017-10-09 DIAGNOSIS — J45909 Unspecified asthma, uncomplicated: Secondary | ICD-10-CM | POA: Insufficient documentation

## 2017-10-09 DIAGNOSIS — F17210 Nicotine dependence, cigarettes, uncomplicated: Secondary | ICD-10-CM | POA: Diagnosis not present

## 2017-10-09 DIAGNOSIS — M79609 Pain in unspecified limb: Secondary | ICD-10-CM

## 2017-10-09 MED ORDER — IBUPROFEN 400 MG PO TABS
400.0000 mg | ORAL_TABLET | Freq: Once | ORAL | Status: AC | PRN
  Administered 2017-10-09: 400 mg via ORAL
  Filled 2017-10-09: qty 1

## 2017-10-09 MED ORDER — NAPROXEN 500 MG PO TABS: 500.0000 mg | ORAL_TABLET | Freq: Two times a day (BID) | ORAL | 0 refills | Status: DC

## 2017-10-09 MED ORDER — NAPROXEN 250 MG PO TABS
500.0000 mg | ORAL_TABLET | Freq: Once | ORAL | Status: AC
  Administered 2017-10-09: 500 mg via ORAL
  Filled 2017-10-09: qty 2

## 2017-10-09 NOTE — Discharge Instructions (Addendum)
Please read attached information regarding your condition. Take naproxen twice daily to help with pain and swelling. Wear knee sleeve as directed. Ice and elevate extremity as tolerated. Follow-up with orthopedic specialist listed below for further evaluation. Return to ED for worsening symptoms, numbness in legs, red hot or tender joint, falls or injuries.

## 2017-10-09 NOTE — ED Provider Notes (Signed)
[facility]   CSN: [id number] Arrival date & time: 10/09/17  1501     History   Chief Complaint Chief Complaint  Patient presents with  . Knee Pain    HPI Keith Atkinson is a 57 y.o. male with a past medical history of asthma, who presents to ED for evaluation of right knee pain and aching for the past month that has progressively worsened over the past day.  States that pain is worse with weightbearing.  He has been ambulatory but with pain.  He cannot recall any inciting event that caused the pain to occur 1 month ago.  He is not taking any medications to help with symptoms. He denies any recent surgeries, recent prolonged travel, injuries, falls, prior fracture, dislocations or procedures in the area, history of DVT or PE, chest pain, shortness of breath, history of gout, numbness in leg.  HPI  Past Medical History:  Diagnosis Date  . Asthma     There are no active problems to display for this patient.   History reviewed. No pertinent surgical history.     Home Medications    Prior to Admission medications   Medication Sig Start Date End Date Taking? Authorizing Provider  cyclobenzaprine (FLEXERIL) 10 MG tablet Take 1 tablet (10 mg total) by mouth 2 (two) times daily as needed for muscle spasms. Patient not taking: Reported on 05/16/2017 03/12/15   Arby BarrettePfeiffer, Marcy, MD  ibuprofen (ADVIL,MOTRIN) 600 MG tablet Take 1 tablet (600 mg total) by mouth every 6 (six) hours as needed. Patient not taking: Reported on 05/16/2017 03/12/15   Arby BarrettePfeiffer, Marcy, MD  predniSONE (DELTASONE) 50 MG tablet Take 1 tablet daily with breakfast 05/16/17   Pisciotta, Joni ReiningNicole, PA-C    Family History No family history on file.  Social History Social History   Tobacco Use  . Smoking status: Current Every Day Smoker    Packs/day: 0.50    Types: Cigarettes  . Smokeless tobacco: Never Used  Substance Use Topics  . Alcohol use: Yes    Comment:  occ  . Drug use: Yes    Types: Marijuana     Allergies   Patient has no known allergies.   Review of Systems Review of Systems  Constitutional: Negative for chills and fever.  Gastrointestinal: Negative for nausea and vomiting.  Musculoskeletal: Positive for arthralgias, gait problem and joint swelling. Negative for myalgias, neck pain and neck stiffness.  Skin: Negative for color change and wound.  Neurological: Negative for weakness and numbness.     Physical Exam Updated Vital Signs BP (!) 146/96 (BP Location: Right Arm)   Pulse 87   Temp 98.7 F (37.1 C) (Oral)   Resp 18   Ht 5\' 11"  (1.803 m)   Wt 69.9 kg (154 lb)   SpO2 97%   BMI 21.48 kg/m   Physical Exam  Constitutional: He appears well-developed and well-nourished. No distress.  HENT:  Head: Normocephalic and atraumatic.  Eyes: Conjunctivae and EOM are normal. No scleral icterus.  Neck: Normal range of motion.  Pulmonary/Chest: Effort normal. No respiratory distress.  Musculoskeletal: Normal range of motion. He exhibits edema and tenderness. He exhibits no deformity.  Slight tenderness and edema noted throughout the entire right patella.  Able to perform extension but reports pain with flexion of the knee.  Sensation intact to light touch of bilateral lower extremities.  Strength 5/5 in bilateral lower extremities.  2+ DP pulse noted bilaterally.  Neurological: He is alert.  Skin: No rash noted. He is not diaphoretic.  Psychiatric: He has a normal mood and affect.  Nursing note and vitals reviewed.    ED Treatments / Results  Labs (all labs ordered are listed, but only abnormal results are displayed) Labs Reviewed - No data to display  EKG  EKG Interpretation None       Radiology Dg Knee Complete 4 Views Right  Result Date: 10/09/2017 CLINICAL DATA:  RIGHT knee pain and swelling EXAM: RIGHT KNEE - COMPLETE 4+ VIEW COMPARISON:  None FINDINGS: Osseous mineralization normal. Joint spaces preserved.  No acute fracture, dislocation, or bone destruction. Scattered clothing artifacts. Obliquity on lateral view due to patient movement despite repeating image. IMPRESSION: No acute osseous abnormalities. Electronically Signed   By: Ulyses Southward M.D.   On: 10/09/2017 16:02    VASCULAR LAB PRELIMINARY  PRELIMINARY  PRELIMINARY  PRELIMINARY  Right lower extremity venous duplex completed.    Preliminary report:  There is no DVT or SVT noted in the right lower extremity.  There is fluid noted on the top of the knee.   KANADY, CANDACE, RVT 10/09/2017, 6:51 PM    Procedures Procedures (including critical care time)  Medications Ordered in ED Medications  ibuprofen (ADVIL,MOTRIN) tablet 400 mg (400 mg Oral Given 10/09/17 1515)     Initial Impression / Assessment and Plan / ED Course  I have reviewed the triage vital signs and the nursing notes.  Pertinent labs & imaging results that were available during my care of the patient were reviewed by me and considered in my medical decision making (see chart for details).     Patient, with a past medical history of asthma, presents to ED for evaluation of gradual right over the past day.  Cannot recall any inciting event that caused the pain.  He has been ambulatory but reports pain worse with weightbearing.  He reports that mostly his pain in his right knee but also sometimes he feels a similar type of pain in his left knee.  No previous fracture, dislocation or procedure in the area, recent surgeries, recent prolonged travel, history of DVT or PE.  On physical exam he is overall well-appearing.  He does have some edema and tenderness of the right knee.  He is able to perform full active and passive range of motion of the knee although pain with flexion occurs.  X-ray returned as negative.  Although patient had no DVT risk factors, he did have some calf tenderness present as well as popliteal edema.  Ultrasound of right lower extremity return as  negative for DVT. Fluid was noted. Due to patient's lack of radiological findings and physical exam findings, I have low suspicion for infectious cause or vascular cause of his symptoms.  I suspect that he could have some degree of arthritis considering he has similar symptoms intermittently in the left knee as well.  Will give patient anti-inflammatories, knee sleeve and advised him to follow-up with orthopedics for further evaluation if symptoms persist.  Patient appears stable for discharge at this time.  Strict return precautions given.  Final Clinical Impressions(s) / ED Diagnoses   Final diagnoses:  None    ED Discharge Orders    None     Portions of this note were generated with Dragon dictation software. Dictation errors may occur despite best attempts at proofreading.    Dietrich Pates, PA-C 10/09/17 1852    Clarene Duke Ambrose Finland, MD 10/10/17 [phone]

## 2017-10-09 NOTE — Progress Notes (Signed)
VASCULAR LAB PRELIMINARY  PRELIMINARY  PRELIMINARY  PRELIMINARY  Right lower extremity venous duplex completed.    Preliminary report:  There is no DVT or SVT noted in the right lower extremity.  There is fluid noted on the top of the knee.   Kimya Mccahill, RVT 10/09/2017, 6:51 PM

## 2017-10-09 NOTE — ED Triage Notes (Signed)
Pt here via [facility] for R knee pain.  States pain to R knee for 1 month, but today pain became unbearable to the point he has difficulty bearing weight.  R knee swollen and warm to touch.

## 2017-10-12 ENCOUNTER — Ambulatory Visit (INDEPENDENT_AMBULATORY_CARE_PROVIDER_SITE_OTHER): Payer: Self-pay | Admitting: Orthopaedic Surgery

## 2017-10-12 ENCOUNTER — Ambulatory Visit (INDEPENDENT_AMBULATORY_CARE_PROVIDER_SITE_OTHER): Payer: Self-pay

## 2017-10-12 ENCOUNTER — Encounter (INDEPENDENT_AMBULATORY_CARE_PROVIDER_SITE_OTHER): Payer: Self-pay | Admitting: Orthopaedic Surgery

## 2017-10-12 DIAGNOSIS — M1711 Unilateral primary osteoarthritis, right knee: Secondary | ICD-10-CM

## 2017-10-12 MED ORDER — METHYLPREDNISOLONE ACETATE 40 MG/ML IJ SUSP
40.0000 mg | INTRAMUSCULAR | Status: AC | PRN
  Administered 2017-10-12: 40 mg via INTRA_ARTICULAR

## 2017-10-12 MED ORDER — BUPIVACAINE HCL 0.25 % IJ SOLN
2.0000 mL | INTRAMUSCULAR | Status: AC | PRN
  Administered 2017-10-12: 2 mL via INTRA_ARTICULAR

## 2017-10-12 MED ORDER — LIDOCAINE HCL 1 % IJ SOLN
2.0000 mL | INTRAMUSCULAR | Status: AC | PRN
  Administered 2017-10-12: 2 mL

## 2017-10-12 NOTE — Progress Notes (Signed)
Office Visit Note   Patient: Keith Atkinson           Date of Birth: 07-Oct-1960           MRN: [id number] Visit Date: 10/12/2017              Requested by: No referring provider defined for this encounter. PCP: System, Pcp Not In   Assessment & Plan: Visit Diagnoses:  1. Unilateral primary osteoarthritis, right knee     Plan: Impression right knee reactive synovitis from underlying osteoarthritis.  Today we proceeded with a right knee joint aspiration followed by cortisone injection.  We are able to obtain approximately 70 cc of serosanguineous joint fluid.  We are sending this off for cell count culture crystals although I think it is just an inflammatory response.  He is encouraged to rest ice and elevate as much as possible for the next few days.  Follow-up with us on an as-needed basis.  Follow-Up Instructions: Return if symptoms worsen or fail to improve.   Orders:  Orders Placed This Encounter  Procedures  . Large Joint Inj: R knee  . Body Fluid Culture  . XR Knee Complete 4 Views Left  . Cell count + diff,  w/ cryst-synvl fld   No orders of the defined types were placed in this encounter.     Procedures: Large Joint Inj: R knee on 10/12/2017 8:50 AM Indications: pain and joint swelling Details: 22 G needle, anterolateral approach Medications: 2 mL bupivacaine 0.25 %; 2 mL lidocaine 1 %; 40 mg methylPREDNISolone acetate 40 MG/ML Aspirate: serous; sent for lab analysis Outcome: tolerated well, no immediate complications  70cc      Clinical Data: No additional findings.   Subjective: Chief Complaint  Patient presents with  . Right Knee - Pain    HPI this is a pleasant 57 year old new patient who presents our clinic today with right knee pain.  He states that both knees have been bothering him for several years but both have been tolerable.  He says that on 10/09/2016 he went to stand up and his right knee became so painful that he was unable to bear weight.   He called EMS and was taken to the ED.  X-rays were obtained.  Negative for fracture.  He underwent a Doppler ultrasound of the right lower extremity which was negative for DVT/SVT.  He was given ibuprofen which is minimally helped.  He comes in today for follow-up.  He continues to have marked pain to the entire aspect of the right knee.  He describes this as constant in nature.  He does have some locking catching as well as instability.  Pain is worse with stairs as well as pivoting.  He has tried ibuprofen with minimal relief of symptoms.  No fevers chills or any other systemic symptoms.  No previous injury cortisone injection or surgical intervention to either knee.  Review of Systems as detailed in HPI.  All others reviewed and are negative.   Objective: Vital Signs: There were no vitals taken for this visit.  Physical Exam well-developed well-nourished gentleman in no acute distress.  Alert and oriented x3.  Ortho Exam examination of the right knee reveals a 1+ effusion.  Varus deformity.  Range of motion 0-90 degrees.  Medial joint line tenderness.  Negative patellofemoral crepitus.  Ligaments are stable.  He is neurovascular intact distally.  Specialty Comments:  No specialty comments available.  Imaging: Xr Knee Complete 4 Views Left  Result  Date: 10/12/2017 4 views of the right knee reveal moderate degenerative changes medial compartment.    PMFS History: There are no active problems to display for this patient.  Past Medical History:  Diagnosis Date  . Asthma     History reviewed. No pertinent family history.  History reviewed. No pertinent surgical history. Social History   Occupational History  . Not on file  Tobacco Use  . Smoking status: Current Every Day Smoker    Packs/day: 0.50    Types: Cigarettes  . Smokeless tobacco: Never Used  Substance and Sexual Activity  . Alcohol use: Yes    Comment: occ  . Drug use: Yes    Types: Marijuana  . Sexual activity:  Not on file

## 2017-10-12 NOTE — Addendum Note (Signed)
Addended by: Albertina ParrGARCIA, Zeus Marquis on: 10/12/2017 10:32 AM   Modules accepted: Orders

## 2017-10-13 LAB — SYNOVIAL CELL COUNT + DIFF, W/ CRYSTALS
Basophils, %: 0 %
Eosinophils-Synovial: 0 % (ref 0–2)
LYMPHOCYTES-SYNOVIAL FLD: 13 % (ref 0–74)
Monocyte/Macrophage: 39 % (ref 0–69)
Neutrophil, Synovial: 48 % — ABNORMAL HIGH (ref 0–24)
SYNOVIOCYTES, %: 0 % (ref 0–15)
WBC, Synovial: 206 cells/uL — ABNORMAL HIGH (ref <150)

## 2017-10-13 LAB — TIQ-NTM

## 2017-10-18 LAB — ANAEROBIC AND AEROBIC CULTURE
AER RESULT:: NO GROWTH
MICRO NUMBER: 90028603
MICRO NUMBER: 90028604
SPECIMEN QUALITY: ADEQUATE
SPECIMEN QUALITY:: ADEQUATE

## 2017-11-03 ENCOUNTER — Emergency Department (HOSPITAL_COMMUNITY)
Admission: EM | Admit: 2017-11-03 | Discharge: 2017-11-03 | Disposition: A | Discharge status: Home / Self Care | Payer: Medicaid Other | Attending: Emergency Medicine | Admitting: Emergency Medicine

## 2017-11-03 ENCOUNTER — Encounter (HOSPITAL_COMMUNITY): Payer: Self-pay

## 2017-11-03 ENCOUNTER — Emergency Department (HOSPITAL_COMMUNITY): Payer: Medicaid Other

## 2017-11-03 DIAGNOSIS — F17210 Nicotine dependence, cigarettes, uncomplicated: Secondary | ICD-10-CM | POA: Insufficient documentation

## 2017-11-03 DIAGNOSIS — J4541 Moderate persistent asthma with (acute) exacerbation: Secondary | ICD-10-CM | POA: Insufficient documentation

## 2017-11-03 LAB — COMPREHENSIVE METABOLIC PANEL
ALT: 16 U/L — ABNORMAL LOW (ref 17–63)
ANION GAP: 6 (ref 5–15)
AST: 24 U/L (ref 15–41)
Albumin: 4 g/dL (ref 3.5–5.0)
Alkaline Phosphatase: 76 U/L (ref 38–126)
BUN: 10 mg/dL (ref 6–20)
CHLORIDE: 104 mmol/L (ref 101–111)
CO2: 28 mmol/L (ref 22–32)
Calcium: 9.3 mg/dL (ref 8.9–10.3)
Creatinine, Ser: 1.1 mg/dL (ref 0.61–1.24)
GFR calc Af Amer: >60 mL/min (ref >60)
Glucose, Bld: 139 mg/dL — ABNORMAL HIGH (ref 65–99)
Potassium: 3.4 mmol/L — ABNORMAL LOW (ref 3.5–5.1)
SODIUM: 138 mmol/L (ref 135–145)
Total Bilirubin: 0.8 mg/dL (ref 0.3–1.2)
Total Protein: 7.7 g/dL (ref 6.5–8.1)

## 2017-11-03 LAB — CBC
HCT: 41.4 % (ref 39.0–52.0)
Hemoglobin: 13.7 g/dL (ref 13.0–17.0)
MCH: 30.4 pg (ref 26.0–34.0)
MCHC: 33.1 g/dL (ref 30.0–36.0)
MCV: 92 fL (ref 78.0–100.0)
PLATELETS: 185 10*3/uL (ref 150–400)
RBC: 4.5 MIL/uL (ref 4.22–5.81)
RDW: 13.6 % (ref 11.5–15.5)
WBC: 9.3 10*3/uL (ref 4.0–10.5)

## 2017-11-03 MED ORDER — ALBUTEROL SULFATE HFA 108 (90 BASE) MCG/ACT IN AERS
1.0000 | INHALATION_SPRAY | Freq: Once | RESPIRATORY_TRACT | Status: AC
  Administered 2017-11-03: 1 via RESPIRATORY_TRACT
  Filled 2017-11-03: qty 6.7

## 2017-11-03 MED ORDER — ALBUTEROL (5 MG/ML) CONTINUOUS INHALATION SOLN
10.0000 mg/h | INHALATION_SOLUTION | Freq: Once | RESPIRATORY_TRACT | Status: AC
  Administered 2017-11-03: 10 mg/h via RESPIRATORY_TRACT
  Filled 2017-11-03: qty 20

## 2017-11-03 MED ORDER — METHYLPREDNISOLONE SODIUM SUCC 125 MG IJ SOLR
60.0000 mg | Freq: Once | INTRAMUSCULAR | Status: AC
  Administered 2017-11-03: 60 mg via INTRAVENOUS
  Filled 2017-11-03: qty 2

## 2017-11-03 MED ORDER — PREDNISONE 10 MG (21) PO TBPK: ORAL_TABLET | ORAL | 0 refills | Status: DC

## 2017-11-03 MED ORDER — ALBUTEROL SULFATE (2.5 MG/3ML) 0.083% IN NEBU
5.0000 mg | INHALATION_SOLUTION | Freq: Once | RESPIRATORY_TRACT | Status: AC
  Administered 2017-11-03: 5 mg via RESPIRATORY_TRACT
  Filled 2017-11-03: qty 6

## 2017-11-03 MED ORDER — MOMETASONE FURO-FORMOTEROL FUM 200-5 MCG/ACT IN AERO
2.0000 | INHALATION_SPRAY | Freq: Two times a day (BID) | RESPIRATORY_TRACT | Status: AC
  Administered 2017-11-03: 2 via RESPIRATORY_TRACT
  Filled 2017-11-03: qty 8.8

## 2017-11-03 MED ORDER — AEROCHAMBER PLUS FLO-VU SMALL MISC
1.0000 | Freq: Once | Status: AC
  Administered 2017-11-03: 1
  Filled 2017-11-03 (×2): qty 1

## 2017-11-03 NOTE — ED Provider Notes (Signed)
[facility]-EMERGENCY DEPT Provider Note   CSN: [id number] Arrival date & time: 11/03/17  1208     History   Chief Complaint No chief complaint on file.   HPI Keith Atkinson is a 57 y.o. male with a history of asthma who [facility] with a chief complaint of dyspnea with associated chest tightness, productive cough with yellow sputum, and wheezing that began last night.  He used his home albuterol inhaler almost every hour after symptoms began, but ran out this morning.  His wheezing resolved completely for about 30-45 minutes after each puff of albuterol.  His dyspnea has been constant and significantly worsened this morning while he was out in the cold weather.  Dyspnea has been worse with exertion and alleviated by nothing. He reports cold weather and temperature changes are a trigger for his asthma.  He also endorses nasal congestion and sinus pain and pressure over the last 2-3 days.  He denies fever, chills, chest pain, numbness, weakness, diaphoresis, myalgias, apnea, choking, sore throat, abdominal pain, or N/V/D.   He was given 10 mg of albuterol and 0.5 mg of Atrovent in route to the ED by EMS.   He denies a history of DVT or PE.   He was previously on Advair, but ran out of the medication several months ago he does not currently have a PCP or health insurance.  He is a current, everyday smoker.   The history is provided by the patient. No language interpreter was used.    Past Medical History:  Diagnosis Date  . Asthma     There are no active problems to display for this patient.   History reviewed. No pertinent surgical history.     Home Medications    Prior to Admission medications   Medication Sig Start Date End Date Taking? Authorizing Provider  albuterol (PROAIR HFA) 108 (90 Base) MCG/ACT inhaler Inhale 2 puffs into the lungs every 6 (six) hours as needed for wheezing or  shortness of breath.   Yes [provider]  predniSONE (STERAPRED UNI-PAK 21 TAB) 10 MG (21) TBPK tablet Take 6 tabs by mouth daily  for 2 days, then 5 tabs for 2 days, then 4 tabs for 2 days, then 3 tabs for 2 days, 2 tabs for 2 days, then 1 tab by mouth daily for 2 days 11/03/17   Barkley BoardsMcDonald, Raghad Lorenz A, PA-C    Family History History reviewed. No pertinent family history.  Social History Social History   Tobacco Use  . Smoking status: Current Every Day Smoker    Packs/day: 0.50    Types: Cigarettes  . Smokeless tobacco: Never Used  Substance Use Topics  . Alcohol use: Yes    Comment: occ  . Drug use: Yes    Types: Marijuana     Allergies   Patient has no known allergies.   Review of Systems Review of Systems  Constitutional: Negative for activity change, appetite change, chills and fever.  HENT: Positive for congestion.   Respiratory: Positive for cough, shortness of breath and wheezing.   Cardiovascular: Negative for chest pain.  Gastrointestinal: Negative for abdominal pain, diarrhea, nausea and vomiting.  Genitourinary: Negative for dysuria.  Musculoskeletal: Negative for back pain and myalgias.  Skin: Negative for rash.  Allergic/Immunologic: Negative for immunocompromised state.  Neurological: Negative for weakness, numbness and headaches.  Psychiatric/Behavioral: Negative for confusion.   Physical Exam Updated Vital Signs BP (!) 160/88  Pulse (!) 104   Temp 98.6 F (37 C) (Oral)   Resp 18   SpO2 96%   Physical Exam  Constitutional: He appears well-developed.  HENT:  Head: Normocephalic.  Nose: Rhinorrhea present. Right sinus exhibits no maxillary sinus tenderness and no frontal sinus tenderness. Left sinus exhibits no maxillary sinus tenderness and no frontal sinus tenderness.  Eyes: Conjunctivae are normal.  Neck: Normal range of motion. Neck supple.  Cardiovascular: Normal rate, regular rhythm, normal heart sounds and intact distal pulses. Exam  reveals no gallop and no friction rub.  No murmur heard. Pulmonary/Chest: Effort normal. No stridor. No respiratory distress. He has wheezes. He has no rales. He exhibits no tenderness.  Bilateral inspiratory and expiratory wheezes noted in all fields.  Poor air movement throughout.  No accessory muscle use or retractions.  Mild conversational dyspnea, but able to speak in fluent, complete sentences.  Abdominal: Soft. He exhibits no distension.  Neurological: He is alert.  Skin: Skin is warm and dry.  Psychiatric: His behavior is normal.  Nursing note and vitals reviewed.    ED Treatments / Results  Labs (all labs ordered are listed, but only abnormal results are displayed) Labs Reviewed  COMPREHENSIVE METABOLIC PANEL - Abnormal; Notable for the following components:      Result Value   Potassium 3.4 (*)    Glucose, Bld 139 (*)    ALT 16 (*)    All other components within normal limits  CBC    EKG  EKG Interpretation None       Radiology Dg Chest 2 View  Result Date: 11/03/2017 CLINICAL DATA:  Shortness of breath and cough EXAM: CHEST  2 VIEW COMPARISON:  05/16/2017 FINDINGS: Lungs are hyperexpanded. The lungs are clear without focal pneumonia, edema, pneumothorax or pleural effusion. The cardiopericardial silhouette is within normal limits for size. The visualized bony structures of the thorax are intact. IMPRESSION: No active cardiopulmonary disease. Electronically Signed   By: Kennith Center M.D.   On: 11/03/2017 13:09    Procedures Procedures (including critical care time)  Medications Ordered in ED Medications  albuterol (PROVENTIL) (2.5 MG/3ML) 0.083% nebulizer solution 5 mg (5 mg Nebulization Given 11/03/17 1317)  methylPREDNISolone sodium succinate (SOLU-MEDROL) 125 mg/2 mL injection 60 mg (60 mg Intravenous Given 11/03/17 1316)  AEROCHAMBER PLUS FLO-VU SMALL device MISC 1 each (1 each Other Given 11/03/17 1317)  albuterol (PROVENTIL HFA;VENTOLIN HFA) 108 (90 Base)  MCG/ACT inhaler 1 puff (1 puff Inhalation Given 11/03/17 1317)  albuterol (PROVENTIL,VENTOLIN) solution continuous neb (10 mg/hr Nebulization Given 11/03/17 1503)  mometasone-formoterol (DULERA) 200-5 MCG/ACT inhaler 2 puff (2 puffs Inhalation Given 11/03/17 1650)     Initial Impression / Assessment and Plan / ED Course  I have reviewed the triage vital signs and the nursing notes.  Pertinent labs & imaging results that were available during my care of the patient were reviewed by me and considered in my medical decision making (see chart for details).     57 year old male with a history of asthma with persistent dyspnea, cough, and wheezing since last night with 2-3 days of nasal congestion and sinus pain and pressure.  Dyspnea was controlled with albuterol inhaler last night, but he ran out of the medication this morning.  He has been out of his home Advair for the last few months.  Patient was given ipratropium and albuterol nebulizer in route.  On initial examination, he had mild conversational dyspnea and poor air movement with inspiratory and expiratory wheezes noted  bilaterally in all fields.  Repeated nebulizer treatment, conversational dyspnea resolved but SaO2, decreased from 100% to 93% at rest.  125 of Solu-Medrol given in the ED. Continuous nebulizer ordered and the patient maintained SaO2 of 96% with ambulation.  Consulted case management who referred the patient to [facility] as he does not currently have medical insurance or a PCP.  Provided the patient with a new albuterol and Dulera inhaler for maintenance.  We will discharge the patient to home with prednisone taper.  He is currently in no acute respiratory distress.  He is hemodynamically stable and only mildly tachycardic in the low 100s.  Chest x-ray is negative for acute cardiopulmonary disease.  CBC and CMP are reassuring.  EKG not ordered as the patient is not having any chest pain.  Doubt CAD.  Strict return  precautions given.  The patient is safe for discharge to home at this time.  Final Clinical Impressions(s) / ED Diagnoses   Final diagnoses:  Moderate persistent asthma with exacerbation    ED Discharge Orders        Ordered    predniSONE (STERAPRED UNI-PAK 21 TAB) 10 MG (21) TBPK tablet     11/03/17 1719       Breawna Montenegro A, PA-C 11/03/17 1732    Nira Conn, MD 11/04/17 2042

## 2017-11-03 NOTE — Discharge Instructions (Signed)
Take 2 puffs of the Dulera in the morning and 2 puffs at night every day.  This is a maintenance medication for your asthma and should not be used in an emergency.  Take 2 puffs of your albuterol inhaler using the spacer every 4 hours as needed for shortness of breath.   For the prednisone: staring tomorrow (1/31), take 6 tabs by mouth daily  for 2 days, then 5 tabs for 2 days, then 4 tabs for 2 days, then 3 tabs for 2 days, 2 tabs for 2 days, then 1 tab by mouth daily for 2 days.  If you [facility] to [facility] on [address], they have a pharmacy where you can get your prescription filled.  They also have financial services available to help you apply for medical insurance, such as Medicaid or the [facility] card.  Their information is listed above.  If you develop worsening difficulty breathing, gasping for breath, high fever, or other new concerning symptoms, please return to the emergency department for re-evaluation.

## 2017-11-03 NOTE — Care Management Note (Signed)
Case Management Note  CM consulted for no pcp and no ins with need for medication assistance and follow up.  CM spoke with pt who states [facility] and can be found/contacted there during the day but he is no longer able to use the [facility] portion [facility].  CM spoke with Lavinia SharpsMary Ann Placey, NP who confirmed pt was given a dismissal letter due pt's behavior towards staff.  CM provided pt with the 3 clinic options and discussed the pharmacy and financial counseling aspect of the [facility].  All information placed on AVS.  Pt states he believes he would be able to get [facility].  CM advised him he is likely not going to get an appointment with them until March but to keep trying.  CM explained she would send pt's information to the [facility].  Pt has no further questions and understood information provided.  Updated McDonald, PA who states pt will go home with all needed asthma medications today.  No further CM needs noted at this time.

## 2017-11-03 NOTE — ED Notes (Signed)
Ambulated patient down the hall way. Patient stayed at 96% and dropped once for 2 seconds to 93% and came back up to 98%.

## 2017-11-03 NOTE — ED Triage Notes (Addendum)
[facility] Patient has Shob, Wheezing and bronchi in upper lobes. Patient on second neb treatment. Hx. Of asthma. Patient has a productive cough. Yellow green sputum per patient. Patient normally has inhaler but not today. 10mg  of albuterol and 0.5 Atrovent. 20g right hand.

## 2017-11-04 ENCOUNTER — Telehealth: Payer: Self-pay

## 2017-11-04 MED FILL — predniSONE 10 MG TABS: 10 | 12 days supply | Qty: 42 | Fill #0

## 2017-11-04 NOTE — Telephone Encounter (Signed)
[phone] to schedule him a hospital f/u appointment. No answer. Left message on main line asking for a call back at [phone].

## 2017-11-05 ENCOUNTER — Telehealth: Payer: Self-pay

## 2017-11-05 NOTE — Telephone Encounter (Signed)
[phone] to reach patient and schedule him a hospital follow up. Spoke with [place] (receptionist) and she informed me that patient had been in the facility earlier in the morning but not there at the time of my call. Will try to reach patient again at a later time or day.

## 2017-11-05 NOTE — Telephone Encounter (Signed)
Didn't notice that patient had called earlier and made a hospital follow up visit for 2/26 @ 1:30pm

## 2017-11-08 ENCOUNTER — Telehealth: Payer: Self-pay

## 2017-11-08 NOTE — Telephone Encounter (Signed)
Pt called back, pt confirmed appointment that Is scheduled 2/26. Please f/up if needed

## 2017-11-08 NOTE — Telephone Encounter (Signed)
Message received from Eldridge AbrahamsAngela Kritzer, RN CM requesting a hospital follow up [facility] for the [facility].    Update sent to A. Kritzer, RN CM informing her that the patient has an appointment [facility] on 11/30/17

## 2017-11-30 ENCOUNTER — Encounter: Payer: Self-pay | Admitting: Nurse Practitioner

## 2017-11-30 ENCOUNTER — Ambulatory Visit: Payer: Medicaid Other | Attending: Nurse Practitioner | Admitting: Nurse Practitioner

## 2017-11-30 VITALS — BP 129/86 | HR 69 | Temp 98.4°F | Ht 71.0 in | Wt 153.8 lb

## 2017-11-30 DIAGNOSIS — Z79899 Other long term (current) drug therapy: Secondary | ICD-10-CM | POA: Insufficient documentation

## 2017-11-30 DIAGNOSIS — K219 Gastro-esophageal reflux disease without esophagitis: Secondary | ICD-10-CM

## 2017-11-30 DIAGNOSIS — J4520 Mild intermittent asthma, uncomplicated: Secondary | ICD-10-CM | POA: Insufficient documentation

## 2017-11-30 DIAGNOSIS — F17210 Nicotine dependence, cigarettes, uncomplicated: Secondary | ICD-10-CM | POA: Diagnosis not present

## 2017-11-30 DIAGNOSIS — H269 Unspecified cataract: Secondary | ICD-10-CM | POA: Diagnosis not present

## 2017-11-30 DIAGNOSIS — F17200 Nicotine dependence, unspecified, uncomplicated: Secondary | ICD-10-CM | POA: Insufficient documentation

## 2017-11-30 DIAGNOSIS — Z760 Encounter for issue of repeat prescription: Secondary | ICD-10-CM | POA: Diagnosis present

## 2017-11-30 DIAGNOSIS — J45909 Unspecified asthma, uncomplicated: Secondary | ICD-10-CM | POA: Insufficient documentation

## 2017-11-30 DIAGNOSIS — Z7951 Long term (current) use of inhaled steroids: Secondary | ICD-10-CM | POA: Diagnosis not present

## 2017-11-30 DIAGNOSIS — H5461 Unqualified visual loss, right eye, normal vision left eye: Secondary | ICD-10-CM | POA: Diagnosis not present

## 2017-11-30 DIAGNOSIS — Z791 Long term (current) use of non-steroidal anti-inflammatories (NSAID): Secondary | ICD-10-CM | POA: Insufficient documentation

## 2017-11-30 DIAGNOSIS — M170 Bilateral primary osteoarthritis of knee: Secondary | ICD-10-CM | POA: Diagnosis not present

## 2017-11-30 MED ORDER — MELOXICAM 15 MG PO TABS: 15.0000 mg | ORAL_TABLET | Freq: Every day | ORAL | 0 refills | Status: AC

## 2017-11-30 MED ORDER — ALBUTEROL SULFATE HFA 108 (90 BASE) MCG/ACT IN AERS: 2.0000 | INHALATION_SPRAY | Freq: Four times a day (QID) | RESPIRATORY_TRACT | 1 refills | Status: AC | PRN

## 2017-11-30 MED ORDER — OMEPRAZOLE 40 MG PO CPDR: 40.0000 mg | DELAYED_RELEASE_CAPSULE | Freq: Every day | ORAL | 3 refills | Status: AC

## 2017-11-30 MED ORDER — FLUTICASONE-SALMETEROL 250-50 MCG/DOSE IN AEPB: 1.0000 | INHALATION_SPRAY | Freq: Two times a day (BID) | RESPIRATORY_TRACT | 3 refills | Status: AC

## 2017-11-30 MED FILL — !ADVAIR 250/50 DISKUS: 250-50 | 30 days supply | Qty: 60 | Fill #0

## 2017-11-30 NOTE — Progress Notes (Signed)
Assessment & Plan:  Keith Atkinson was seen today for new patient (initial visit), knee pain, shoulder pain and medication refill.  Diagnoses and all orders for this visit:  Intermittent asthma, unspecified asthma severity, unspecified whether complicated -     albuterol (PROAIR HFA) 108 (90 Base) MCG/ACT inhaler; Inhale 2 puffs into the lungs every 6 (six) hours as needed for wheezing or shortness of breath. -     Fluticasone-Salmeterol (ADVAIR DISKUS) 250-50 MCG/DOSE AEPB; Inhale 1 puff into the lungs every 12 (twelve) hours.    Take medication as prescribed.  Follow asthma action plan.  Bilateral primary osteoarthritis of knee -     meloxicam (MOBIC) 15 MG tablet; Take 1 tablet (15 mg total) by mouth daily. May alternate with extra strength Tylenol for pain relief. Alternate heat and ice application for pain relief as well as relief of swelling. Rest the affected extremities as often as possible. May compression wrap for swelling.  Tobacco dependence Keith Atkinson was counseled on the dangers of tobacco use, and was advised to quit. Reviewed strategies to maximize success, including removing cigarettes and smoking materials from environment, stress management and support of family/friends as well as pharmacological alternatives including: Wellbutrin, Chantix, Nicotine patch, Nicotine gum or lozenges. Smoking cessation support: smoking cessation hotline: 1-800-QUIT-NOW.  Smoking cessation classes are also available through [facility]. Call [phone] or visit our website at [email or web address].   Spent 3 minutes counseling on smoking cessation and patient is not ready to quit.  Gastroesophageal reflux disease, esophagitis presence not specified -     omeprazole (PRILOSEC) 40 MG capsule; Take 1 capsule (40 mg total) by mouth daily.  Avoid GERD triggers Including caffeine, spicy or acidic foods, chocolates, alcoholic beverages, sodas and fried foods.    Patient has  been counseled on age-appropriate routine health concerns for screening and prevention. These are reviewed and up-to-date. Referrals have been placed accordingly. Immunizations are up-to-date or declined.    Subjective:   Chief Complaint  Patient presents with  . New Patient (Initial Visit)    Patient is here to establish care for asthma.  . Knee Pain    Patient have pain on both of his knee for a while but his right knee hurts more. Patient stated he have arthritis in his knee.   . Shoulder Pain    Patient stated his right shoulder feels like a throbbing pain.   . Medication Refill    Patient need medication refills.    HPI Keith Atkinson 57 y.o. male presents to office today to establish care. He was admitted to the hospital on 11-03-2017 with asthma exacerbation due to being out of his inhalers. He was prescribed Dulera 200/25mcg and albuterol inhalers. He states the [facility] inhaler has not provided relief of his symptoms compared to Advair which he has used in the past. He endorses excessive use of his albuterol inhaler recently due to lack of response with Dulera.   Asthma Chronic. He has a history of asthma with onset around the age of 57-18. He has taken advair in the past however he was discharged from his previous clinic due to his irate behavior and has not been able to obtain refills from their office. He currently denies any asthma exacerbation symptoms.    Bilateral Cataract He has an appointment on 12-19-2017 with ophthalmology but reports he will not be able to afford to pay the out of pocket costs. He has been instructed to schedule an appointment with the financial  counselor here in this office.  He will need a referral to ophthalmology once his financial status has been updated with the clinic. He does not have any sight remaining in his right eye and reports left eye blurriness.  Knee Pain Patient presents with knee pain involving the  bilateral knees. Onset of the symptoms  was several years ago. Inciting event: none known. Current symptoms include crepitus sensation, instability, and pain located in the bilateral patellar region and swelling. Pain is aggravated by any weight bearing, twisting and turning.  Patient has had prior knee problems. Evaluation to date: plain films: normal and Ortho consult: Dr. Roda Shutters. Treatment to date: OTC analgesics which are not very effective and joint injection as well as aspiration of fluid on 10-12-2017. Cultures negative.   GERD Chronic. Stable.  Symptoms are well controlled on Prilosec 40 mg daily.   Review of Systems  Constitutional: Negative for fever, malaise/fatigue and weight loss.  HENT: Negative.  Negative for nosebleeds.   Eyes: Negative.  Negative for blurred vision, double vision and photophobia.  Respiratory: Positive for shortness of breath. Negative for cough.   Cardiovascular: Negative.  Negative for chest pain, palpitations and leg swelling.  Gastrointestinal: Positive for heartburn. Negative for nausea and vomiting.  Musculoskeletal: Positive for joint pain. Negative for falls and myalgias.       SEE HPI  Neurological: Negative.  Negative for dizziness, focal weakness, seizures and headaches.  Psychiatric/Behavioral: Negative.  Negative for suicidal ideas.    Past Medical History:  Diagnosis Date  . Arthritis   . Asthma   . Blind right eye   . Cataract    LEFT EYE    No past surgical history on file.  Family History  Problem Relation Age of Onset  . Cancer Mother     Social History Reviewed with no changes to be made today.   Outpatient Medications Prior to Visit  Medication Sig Dispense Refill  . albuterol (PROAIR HFA) 108 (90 Base) MCG/ACT inhaler Inhale 2 puffs into the lungs every 6 (six) hours as needed for wheezing or shortness of breath.    . predniSONE (STERAPRED UNI-PAK 21 TAB) 10 MG (21) TBPK tablet Take 6 tabs by mouth daily  for 2 days, then 5 tabs for 2 days, then 4 tabs for 2 days,  then 3 tabs for 2 days, 2 tabs for 2 days, then 1 tab by mouth daily for 2 days (Patient not taking: Reported on 11/30/2017) 42 tablet 0   No facility-administered medications prior to visit.     Allergies  Allergen Reactions  . Other     Salsa        Objective:    BP 129/86 (BP Location: Left Arm, Patient Position: Sitting, Cuff Size: Normal)   Pulse 69   Temp 98.4 F (36.9 C) (Oral)   Ht 5\' 11"  (1.803 m)   Wt 153 lb 12.8 oz (69.8 kg)   SpO2 93%   BMI 21.45 kg/m  Wt Readings from Last 3 Encounters:  11/30/17 153 lb 12.8 oz (69.8 kg)  10/09/17 154 lb (69.9 kg)  03/12/15 147 lb (66.7 kg)    Physical Exam  Constitutional: He is oriented to person, place, and time. He appears well-developed and well-nourished. He is cooperative.  HENT:  Head: Normocephalic and atraumatic.  Eyes: EOM are normal.  Neck: Normal range of motion.  Cardiovascular: Normal rate, regular rhythm, normal heart sounds and intact distal pulses. Exam reveals no gallop and no friction rub.  No murmur heard. Pulmonary/Chest: Effort normal and breath sounds normal. No tachypnea. No respiratory distress. He has no decreased breath sounds. He has no wheezes. He has no rhonchi. He has no rales. He exhibits no tenderness.  Abdominal: Soft. Bowel sounds are normal.  Musculoskeletal: Normal range of motion. He exhibits no edema.       Right knee: He exhibits normal range of motion and no swelling. Tenderness found. Patellar tendon tenderness noted.       Left knee: He exhibits normal range of motion and no swelling. Tenderness found. Patellar tendon tenderness noted.  Neurological: He is alert and oriented to person, place, and time. Coordination normal.  Skin: Skin is warm and dry.  Psychiatric: He has a normal mood and affect. His behavior is normal. Judgment and thought content normal.  Nursing note and vitals reviewed.      Patient has been counseled extensively about nutrition and exercise as well as the  importance of adherence with medications and regular follow-up. The patient was given clear instructions to go to ER or return to medical center if symptoms don't improve, worsen or new problems develop. The patient verbalized understanding.   Follow-up: Return in about 3 weeks (around 12/21/2017) for f/u OA /asthma.   Claiborne Rigg, FNP-BC [facility] [place], [place] [phone]   12/02/2017, 3:58 PM

## 2017-11-30 NOTE — Patient Instructions (Addendum)
Asthma, Acute Bronchospasm Acute bronchospasm caused by asthma is also referred to as an asthma attack. Bronchospasm means your air passages become narrowed. The narrowing is caused by inflammation and tightening of the muscles in the air tubes (bronchi) in your lungs. This can make it hard to breathe or cause you to wheeze and cough. What are the causes? Possible triggers are:  Animal dander from the skin, hair, or feathers of animals.  Dust mites contained in house dust.  Cockroaches.  Pollen from trees or grass.  Mold.  Cigarette or tobacco smoke.  Air pollutants such as dust, household cleaners, hair sprays, aerosol sprays, paint fumes, strong chemicals, or strong odors.  Cold air or weather changes. Cold air may trigger inflammation. Winds increase molds and pollens in the air.  Strong emotions such as crying or laughing hard.  Stress.  Certain medicines such as aspirin or beta-blockers.  Sulfites in foods and drinks, such as dried fruits and wine.  Infections or inflammatory conditions, such as a flu, cold, or inflammation of the nasal membranes (rhinitis).  Gastroesophageal reflux disease (GERD). GERD is a condition where stomach acid backs up into your esophagus.  Exercise or strenuous activity.  What are the signs or symptoms?  Wheezing.  Excessive coughing, particularly at night.  Chest tightness.  Shortness of breath. How is this diagnosed? Your health care provider will ask you about your medical history and perform a physical exam. A chest X-ray or blood testing may be performed to look for other causes of your symptoms or other conditions that may have triggered your asthma attack. How is this treated? Treatment is aimed at reducing inflammation and opening up the airways in your lungs. Most asthma attacks are treated with inhaled medicines. These include quick relief or rescue medicines (such as bronchodilators) and controller medicines (such as inhaled  corticosteroids). These medicines are sometimes given through an inhaler or a nebulizer. Systemic steroid medicine taken by mouth or given through an IV tube also can be used to reduce the inflammation when an attack is moderate or severe. Antibiotic medicines are only used if a bacterial infection is present. Follow these instructions at home:  Rest.  Drink plenty of liquids. This helps the mucus to remain thin and be easily coughed up. Only use caffeine in moderation and do not use alcohol until you have recovered from your illness.  Do not smoke. Avoid being exposed to secondhand smoke.  You play a critical role in keeping yourself in good health. Avoid exposure to things that cause you to wheeze or to have breathing problems.  Keep your medicines up-to-date and available. Carefully follow your health care provider's treatment plan.  Take your medicine exactly as prescribed.  When pollen or pollution is bad, keep windows closed and use an air conditioner or go to places with air conditioning.  Asthma requires careful medical care. See your health care provider for a follow-up as advised. If you are more than [redacted] weeks pregnant and you were prescribed any new medicines, let your obstetrician know about the visit and how you are doing. Follow up with your health care provider as directed.  After you have recovered from your asthma attack, make an appointment with your outpatient doctor to talk about ways to reduce the likelihood of future attacks. If you do not have a doctor who manages your asthma, make an appointment with a primary care doctor to discuss your asthma. Get help right away if:  You are getting worse.  You have trouble breathing. If severe, call your local emergency services (911 in the [place]  You develop chest pain or discomfort.  You are vomiting.  You are not able to keep fluids down.  You are coughing up yellow, green, brown, or bloody sputum.  You have a fever  and your symptoms suddenly get worse.  You have trouble swallowing. This information is not intended to replace advice given to you by your health care provider. Make sure you discuss any questions you have with your health care provider. Document Released: 01/06/2007 Document Revised: 03/04/2016 Document Reviewed: 03/29/2013 Elsevier Interactive Patient Education  2017 Elsevier Inc.  Asthma Attack Prevention, Adult Although you may not be able to control the fact that you have asthma, you can take actions to prevent episodes of asthma (asthma attacks). These actions include:  Creating a written plan for managing and treating your asthma attacks (asthma action plan).  Monitoring your asthma.  Avoiding things that can irritate your airways or make your asthma symptoms worse (asthma triggers).  Taking your medicines as directed.  Acting quickly if you have signs or symptoms of an asthma attack.  What are some ways to prevent an asthma attack? Create a plan Work with your health care provider to create an asthma action plan. This plan should include:  A list of your asthma triggers and how to avoid them.  A list of symptoms that you experience during an asthma attack.  Information about when to take medicine and how much medicine to take.  Information to help you understand your peak flow measurements.  Contact information for your health care providers.  Daily actions that you can take to control asthma.  Monitor your asthma  To monitor your asthma:  Use your peak flow meter every morning and every evening for 2-3 weeks. Record the results in a journal. A drop in your peak flow numbers on one or more days may mean that you are starting to have an asthma attack, even if you are not having symptoms.  When you have asthma symptoms, write them down in a journal.  Avoid asthma triggers  Work with your health care provider to find out what your asthma triggers are. This can be  done by:  Being tested for allergies.  Keeping a journal that notes when asthma attacks occur and what may have contributed to them.  Asking your health care provider whether other medical conditions make your asthma worse.  Common asthma triggers include:  Dust.  Smoke. This includes campfire smoke and secondhand smoke from tobacco products.  Pet dander.  Trees, grasses or pollens.  Very cold, dry, or humid air.  Mold.  Foods that contain high amounts of sulfites.  Strong smells.  Engine exhaust and air pollution.  Aerosol sprays and fumes from household cleaners.  Household pests and their droppings, including dust mites and cockroaches.  Certain medicines, including NSAIDs.  Once you have determined your asthma triggers, take steps to avoid them. Depending on your triggers, you may be able to reduce the chance of an asthma attack by:  Keeping your home clean. Have someone dust and vacuum your home for you 1 or 2 times a week. If possible, have them use a high-efficiency particulate arrestance (HEPA) vacuum.  Washing your sheets weekly in hot water.  Using allergy-proof mattress covers and casings on your bed.  Keeping pets out of your home.  Taking care of mold and water problems in your home.  Avoiding areas where  people smoke.  Avoiding using strong perfumes or odor sprays.  Avoid spending a lot of time outdoors when pollen counts are high and on very windy days.  Talking with your health care provider before stopping or starting any new medicines.  Medicines Take over-the-counter and prescription medicines only as told by your health care provider. Many asthma attacks can be prevented by carefully following your medicine schedule. Taking your medicines correctly is especially important when you cannot avoid certain asthma triggers. Even if you are doing well, do not stop taking your medicine and do not take less medicine. Act quickly If an asthma attack  happens, acting quickly can decrease how severe it is and how long it lasts. Take these actions:  Pay attention to your symptoms. If you are coughing, wheezing, or having difficulty breathing, do not wait to see if your symptoms go away on their own. Follow your asthma action plan.  If you have followed your asthma action plan and your symptoms are not improving, call your health care provider or seek immediate medical care at the nearest hospital.  It is important to write down how often you need to use your fast-acting rescue inhaler. You can track how often you use an inhaler in your journal. If you are using your rescue inhaler more often, it may mean that your asthma is not under control. Adjusting your asthma treatment plan may help you to prevent future asthma attacks and help you to gain better control of your condition. How can I prevent an asthma attack when I exercise?  Exercise is a common asthma trigger. To prevent asthma attacks during exercise:  Follow advice from your health care provider about whether you should use your fast-acting inhaler before exercising. Many people with asthma experience exercise-induced bronchoconstriction (EIB). This condition often worsens during vigorous exercise in cold, humid, or dry environments. Usually, people with EIB can stay very active by using a fast-acting inhaler before exercising.  Avoid exercising outdoors in very cold or humid weather.  Avoid exercising outdoors when pollen counts are high.  Warm up and cool down when exercising.  Stop exercising right away if asthma symptoms start.  Consider taking part in exercises that are less likely to cause asthma symptoms such as:  Indoor swimming.  Biking.  Walking.  Hiking.  Playing football.  This information is not intended to replace advice given to you by your health care provider. Make sure you discuss any questions you have with your health care provider. Document Released:  09/09/2009 Document Revised: 05/22/2016 Document Reviewed: 03/07/2016 Elsevier Interactive Patient Education  2018 [employer].  Coping with Quitting Smoking Quitting smoking is a physical and mental challenge. You will face cravings, withdrawal symptoms, and temptation. Before quitting, work with your health care provider to make a plan that can help you cope. Preparation can help you quit and keep you from giving in. How can I cope with cravings? Cravings usually last for 5-10 minutes. If you get through it, the craving will pass. Consider taking the following actions to help you cope with cravings:  Keep your mouth busy: ? Chew sugar-free gum. ? Suck on hard candies or a straw. ? Brush your teeth.  Keep your hands and body busy: ? Immediately change to a different activity when you feel a craving. ? Squeeze or play with a ball. ? Do an activity or a hobby, like making bead jewelry, practicing needlepoint, or working with wood. ? Mix up your normal routine. ? Take  a short exercise break. Go for a quick walk or run up and down stairs. ? Spend time in public places where smoking is not allowed.  Focus on doing something kind or helpful for someone else.  Call a friend or family member to talk during a craving.  Join a support group.  Call a quit line, such as 1-800-QUIT-NOW.  Talk with your health care provider about medicines that might help you cope with cravings and make quitting easier for you.  How can I deal with withdrawal symptoms? Your body may experience negative effects as it tries to get used to not having nicotine in the system. These effects are called withdrawal symptoms. They may include:  Feeling hungrier than normal.  Trouble concentrating.  Irritability.  Trouble sleeping.  Feeling depressed.  Restlessness and agitation.  Craving a cigarette.  To manage withdrawal symptoms:  Avoid places, people, and activities that trigger your  cravings.  Remember why you want to quit.  Get plenty of sleep.  Avoid coffee and other caffeinated drinks. These may worsen some of your symptoms.  How can I handle social situations? Social situations can be difficult when you are quitting smoking, especially in the first few weeks. To manage this, you can:  Avoid parties, bars, and other social situations where people might be smoking.  Avoid alcohol.  Leave right away if you have the urge to smoke.  Explain to your family and friends that you are quitting smoking. Ask for understanding and support.  Plan activities with friends or family where smoking is not an option.  What are some ways I can cope with stress? Wanting to smoke may cause stress, and stress can make you want to smoke. Find ways to manage your stress. Relaxation techniques can help. For example:  Breathe slowly and deeply, in through your nose and out through your mouth.  Listen to soothing, relaxing music.  Talk with a family member or friend about your stress.  Light a candle.  Soak in a bath or take a shower.  Think about a peaceful place.  What are some ways I can prevent weight gain? Be aware that many people gain weight after they quit smoking. However, not everyone does. To keep from gaining weight, have a plan in place before you quit and stick to the plan after you quit. Your plan should include:  Having healthy snacks. When you have a craving, it may help to: ? Eat plain popcorn, crunchy carrots, celery, or other cut vegetables. ? Chew sugar-free gum.  Changing how you eat: ? Eat small portion sizes at meals. ? Eat 4-6 small meals throughout the day instead of 1-2 large meals a day. ? Be mindful when you eat. Do not watch television or do other things that might distract you as you eat.  Exercising regularly: ? Make time to exercise each day. If you do not have time for a long workout, do short bouts of exercise for 5-10 minutes several  times a day. ? Do some form of strengthening exercise, like weight lifting, and some form of aerobic exercise, like running or swimming.  Drinking plenty of water or other low-calorie or no-calorie drinks. Drink 6-8 glasses of water daily, or as much as instructed by your health care provider.  Summary  Quitting smoking is a physical and mental challenge. You will face cravings, withdrawal symptoms, and temptation to smoke again. Preparation can help you as you go through these challenges.  You can cope with  cravings by keeping your mouth busy (such as by chewing gum), keeping your body and hands busy, and making calls to family, friends, or a helpline for people who want to quit smoking.  You can cope with withdrawal symptoms by avoiding places where people smoke, avoiding drinks with caffeine, and getting plenty of rest.  Ask your health care provider about the different ways to prevent weight gain, avoid stress, and handle social situations. This information is not intended to replace advice given to you by your health care provider. Make sure you discuss any questions you have with your health care provider. Document Released: 09/18/2016 Document Revised: 09/18/2016 Document Reviewed: 09/18/2016 Elsevier Interactive Patient Education  [employer]2018 Elsevier Inc.

## 2017-12-02 ENCOUNTER — Encounter: Payer: Self-pay | Admitting: Nurse Practitioner

## 2017-12-06 MED FILL — !VENTOLIN HFA INHALER: 108 (90 BAS | 25 days supply | Qty: 18 | Fill #0

## 2017-12-13 ENCOUNTER — Ambulatory Visit: Payer: Self-pay

## 2017-12-21 ENCOUNTER — Ambulatory Visit: Payer: Self-pay | Admitting: Nurse Practitioner

## 2019-12-23 IMAGING — CR DG KNEE COMPLETE 4+V*R*
4 series · 4 of 4 positions shown · non-contrast
Comparison: None

CLINICAL DATA: RIGHT knee pain and swelling

EXAM:
RIGHT KNEE - COMPLETE 4+ VIEW

[knee ap]
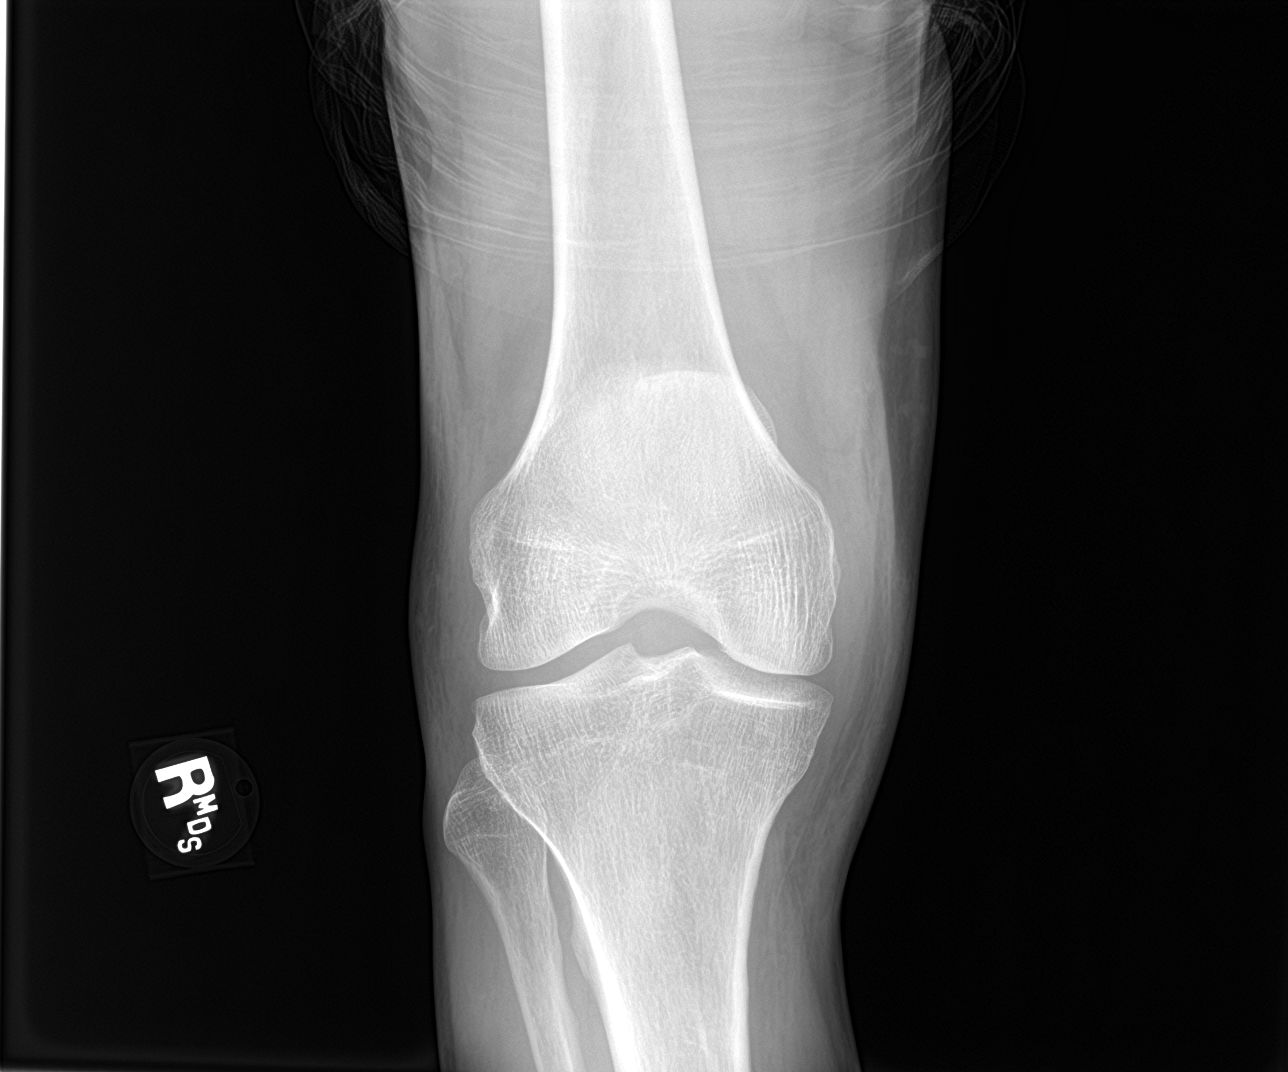

[knee lat]
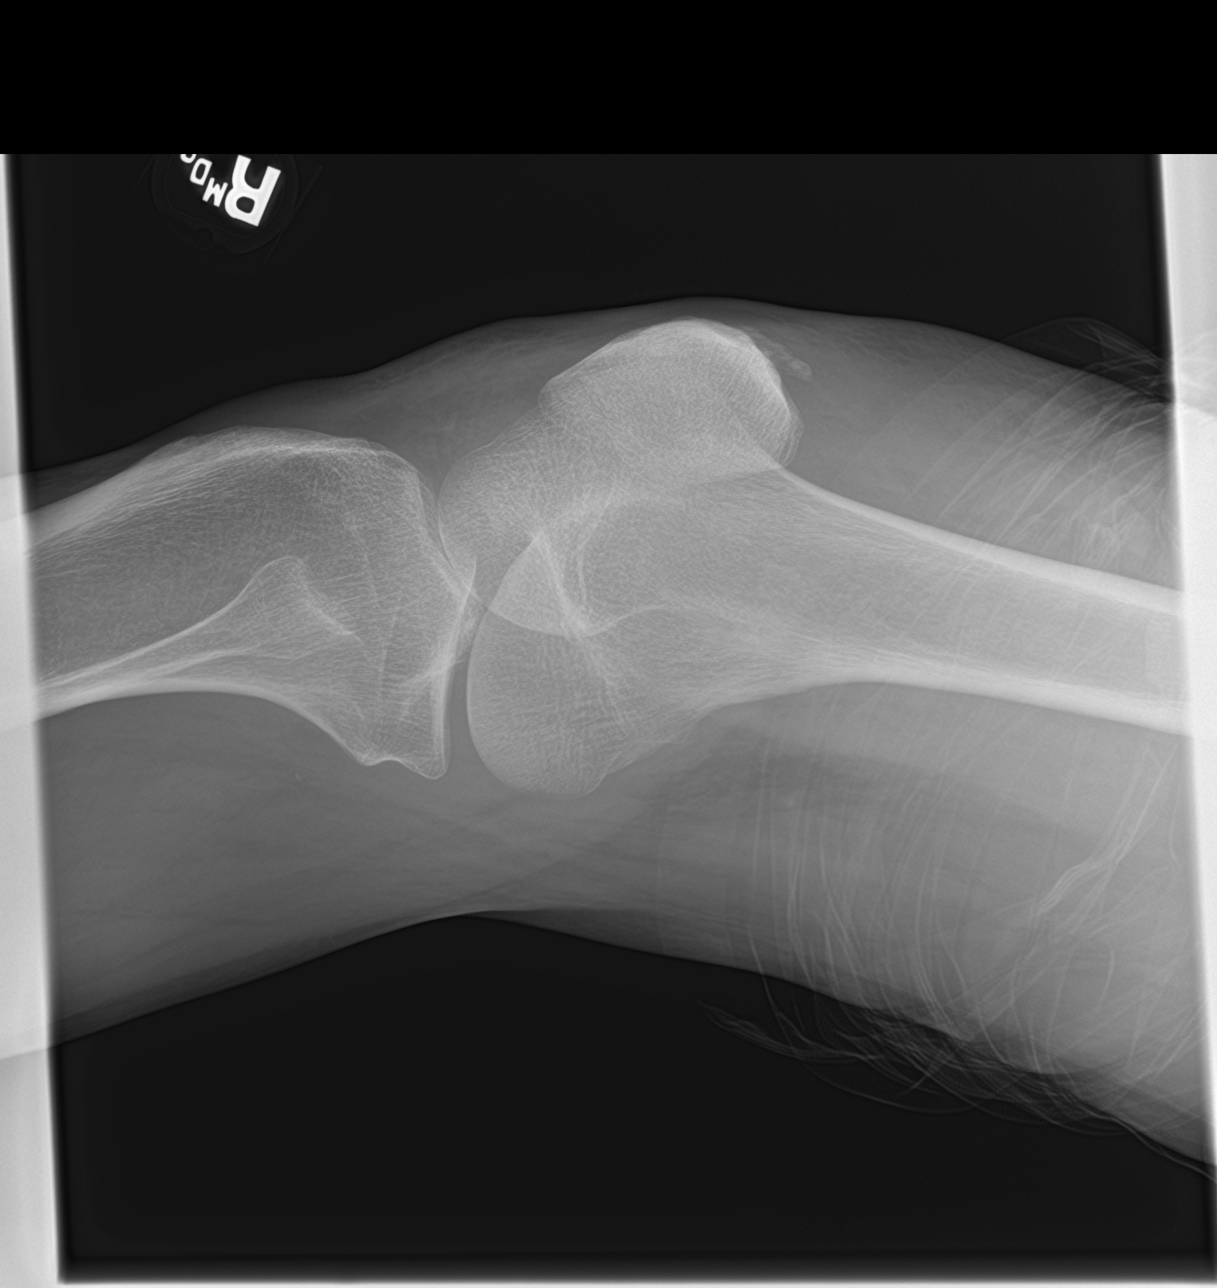

[knee obl (1 of 2)]
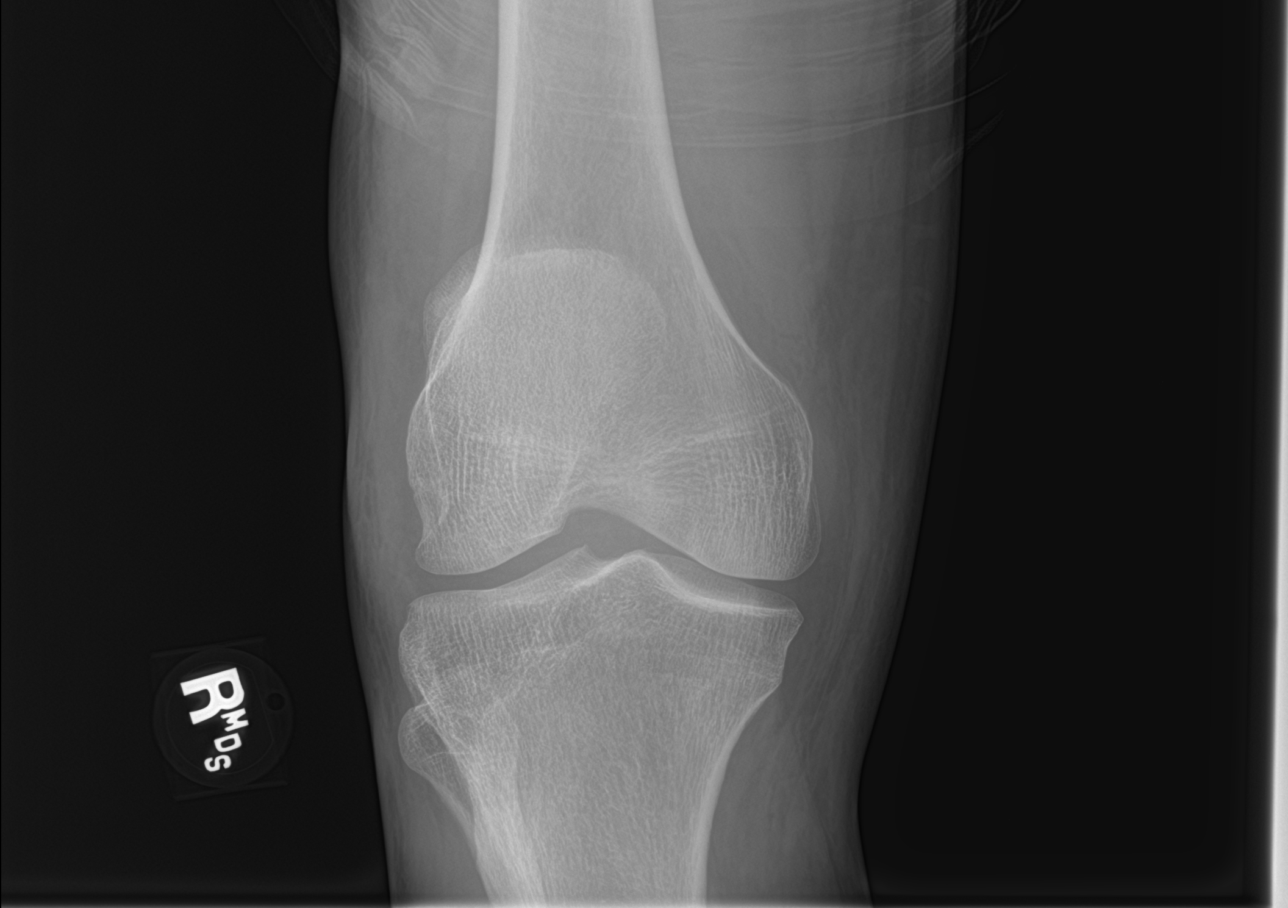

[knee obl (2 of 2)]
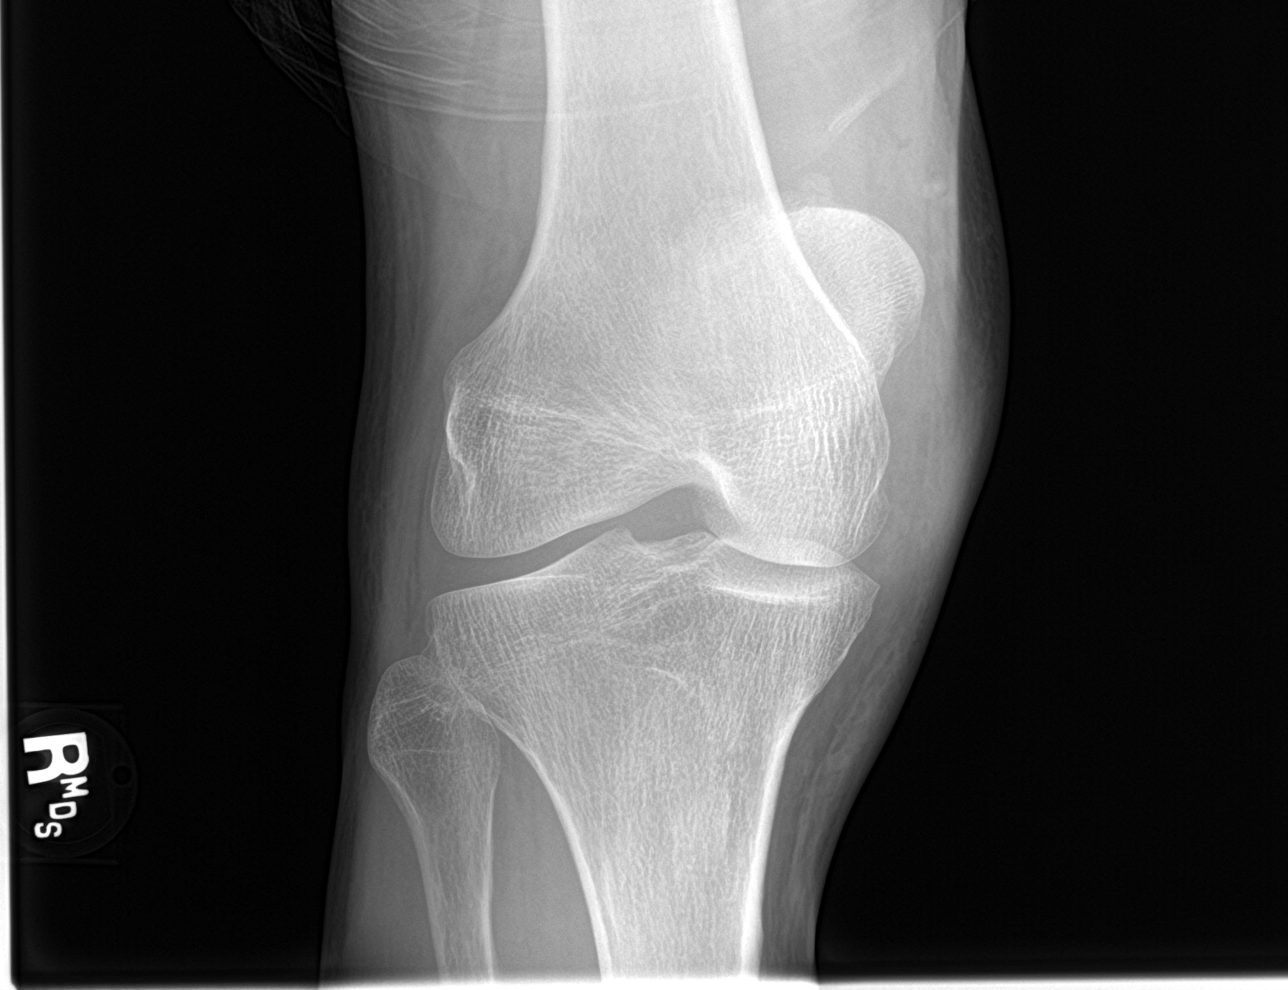

[4 of 4 positions shown; findings below may reference images not displayed]

FINDINGS: Osseous mineralization normal.

Joint spaces preserved.

No acute fracture, dislocation, or bone destruction.

Scattered clothing artifacts.

Obliquity on lateral view due to patient movement despite repeating
image.
IMPRESSION: No acute osseous abnormalities.

## 2023-03-06 DEATH — deceased
